# Patient Record
Sex: Female | Born: 1960 | Race: White | Hispanic: No | Marital: Married | Smoking: Never smoker
Health system: Southern US, Community
[De-identification: ages and names within clinical notes are randomized; demographics above are authoritative.]

## PROBLEM LIST (undated history)

## (undated) DIAGNOSIS — G35 Multiple sclerosis: Secondary | ICD-10-CM

## (undated) DIAGNOSIS — K219 Gastro-esophageal reflux disease without esophagitis: Secondary | ICD-10-CM

## (undated) DIAGNOSIS — F419 Anxiety disorder, unspecified: Secondary | ICD-10-CM

## (undated) DIAGNOSIS — N3010 Interstitial cystitis (chronic) without hematuria: Secondary | ICD-10-CM

## (undated) DIAGNOSIS — G709 Myoneural disorder, unspecified: Secondary | ICD-10-CM

## (undated) DIAGNOSIS — I10 Essential (primary) hypertension: Secondary | ICD-10-CM

## (undated) DIAGNOSIS — R011 Cardiac murmur, unspecified: Secondary | ICD-10-CM

## (undated) DIAGNOSIS — I341 Nonrheumatic mitral (valve) prolapse: Secondary | ICD-10-CM

## (undated) DIAGNOSIS — G4730 Sleep apnea, unspecified: Secondary | ICD-10-CM

## (undated) DIAGNOSIS — D649 Anemia, unspecified: Secondary | ICD-10-CM

## (undated) DIAGNOSIS — E669 Obesity, unspecified: Secondary | ICD-10-CM

## (undated) DIAGNOSIS — F32A Depression, unspecified: Secondary | ICD-10-CM

## (undated) DIAGNOSIS — F329 Major depressive disorder, single episode, unspecified: Secondary | ICD-10-CM

## (undated) DIAGNOSIS — C801 Malignant (primary) neoplasm, unspecified: Secondary | ICD-10-CM

## (undated) DIAGNOSIS — E559 Vitamin D deficiency, unspecified: Secondary | ICD-10-CM

## (undated) DIAGNOSIS — N951 Menopausal and female climacteric states: Secondary | ICD-10-CM

## (undated) DIAGNOSIS — R112 Nausea with vomiting, unspecified: Secondary | ICD-10-CM

## (undated) DIAGNOSIS — R002 Palpitations: Secondary | ICD-10-CM

## (undated) DIAGNOSIS — G894 Chronic pain syndrome: Secondary | ICD-10-CM

## (undated) DIAGNOSIS — Z9889 Other specified postprocedural states: Secondary | ICD-10-CM

## (undated) HISTORY — PX: APPENDECTOMY: SHX54

## (undated) HISTORY — PX: BLADDER SUSPENSION: SHX72

## (undated) HISTORY — PX: JOINT REPLACEMENT: SHX530

## (undated) HISTORY — DX: Myoneural disorder, unspecified: G70.9

## (undated) HISTORY — DX: Chronic pain syndrome: G89.4

## (undated) HISTORY — PX: ABDOMINAL HYSTERECTOMY: SHX81

## (undated) HISTORY — DX: Major depressive disorder, single episode, unspecified: F32.9

## (undated) HISTORY — PX: BREAST SURGERY: SHX581

## (undated) HISTORY — DX: Menopausal and female climacteric states: N95.1

## (undated) HISTORY — DX: Multiple sclerosis: G35

## (undated) HISTORY — DX: Anxiety disorder, unspecified: F41.9

## (undated) HISTORY — PX: TUBAL LIGATION: SHX77

## (undated) HISTORY — DX: Interstitial cystitis (chronic) without hematuria: N30.10

## (undated) HISTORY — DX: Obesity, unspecified: E66.9

## (undated) HISTORY — DX: Vitamin D deficiency, unspecified: E55.9

## (undated) HISTORY — DX: Depression, unspecified: F32.A

## (undated) HISTORY — DX: Anemia, unspecified: D64.9

## (undated) HISTORY — PX: CHOLECYSTECTOMY: SHX55

---

## 2004-07-14 ENCOUNTER — Ambulatory Visit: Payer: Self-pay | Admitting: Urology

## 2007-04-22 ENCOUNTER — Ambulatory Visit: Payer: Self-pay | Admitting: Internal Medicine

## 2007-05-21 ENCOUNTER — Ambulatory Visit: Payer: Self-pay | Admitting: Internal Medicine

## 2007-05-26 ENCOUNTER — Ambulatory Visit: Payer: Self-pay | Admitting: Otolaryngology

## 2007-06-30 ENCOUNTER — Emergency Department: Payer: Self-pay | Admitting: Emergency Medicine

## 2007-07-03 ENCOUNTER — Ambulatory Visit: Payer: Self-pay | Admitting: Internal Medicine

## 2007-08-27 ENCOUNTER — Ambulatory Visit: Payer: Self-pay | Admitting: Family Medicine

## 2007-10-04 ENCOUNTER — Ambulatory Visit: Payer: Self-pay | Admitting: Nurse Practitioner

## 2008-10-01 ENCOUNTER — Ambulatory Visit: Payer: Self-pay | Admitting: Internal Medicine

## 2008-10-08 ENCOUNTER — Ambulatory Visit: Payer: Self-pay | Admitting: Nurse Practitioner

## 2008-11-07 ENCOUNTER — Ambulatory Visit: Payer: Self-pay | Admitting: Internal Medicine

## 2008-11-10 ENCOUNTER — Ambulatory Visit: Payer: Self-pay | Admitting: Family Medicine

## 2009-06-12 ENCOUNTER — Ambulatory Visit: Payer: Self-pay | Admitting: Internal Medicine

## 2009-07-21 ENCOUNTER — Ambulatory Visit: Payer: Self-pay | Admitting: Internal Medicine

## 2010-01-20 ENCOUNTER — Ambulatory Visit: Payer: Self-pay | Admitting: Internal Medicine

## 2010-04-03 ENCOUNTER — Ambulatory Visit: Payer: Self-pay | Admitting: Nurse Practitioner

## 2010-04-10 ENCOUNTER — Ambulatory Visit: Payer: Self-pay | Admitting: Nurse Practitioner

## 2010-05-13 ENCOUNTER — Ambulatory Visit: Payer: Self-pay | Admitting: Surgery

## 2010-05-16 ENCOUNTER — Ambulatory Visit: Payer: Self-pay | Admitting: Surgery

## 2010-05-20 LAB — PATHOLOGY REPORT

## 2010-05-24 ENCOUNTER — Ambulatory Visit: Payer: Self-pay | Admitting: Internal Medicine

## 2010-06-24 ENCOUNTER — Ambulatory Visit: Payer: Self-pay | Admitting: Internal Medicine

## 2010-07-04 ENCOUNTER — Ambulatory Visit: Payer: Self-pay | Admitting: Internal Medicine

## 2010-07-24 ENCOUNTER — Ambulatory Visit: Payer: Self-pay | Admitting: Internal Medicine

## 2010-12-02 ENCOUNTER — Ambulatory Visit: Payer: Self-pay | Admitting: Internal Medicine

## 2010-12-05 ENCOUNTER — Ambulatory Visit: Payer: Self-pay | Admitting: Unknown Physician Specialty

## 2011-03-04 ENCOUNTER — Ambulatory Visit: Payer: Self-pay

## 2011-06-22 IMAGING — MG MAM DGTL ADD VW LT  SCR
2 series · 5 of 5 positions shown · non-contrast
Comparison: 04/03/10 and 10/04/07.

REASON FOR EXAM: MICROCALIFICATIONS
COMMENTS:

PROCEDURE:     MAM - MAM DGTL ADD VW LT  SCR  - April 10, 2010  [DATE]
RESULT:

[Series 5824: L ML · left · 4 of 4 slices shown (1 of 2)]
[im 1/4]
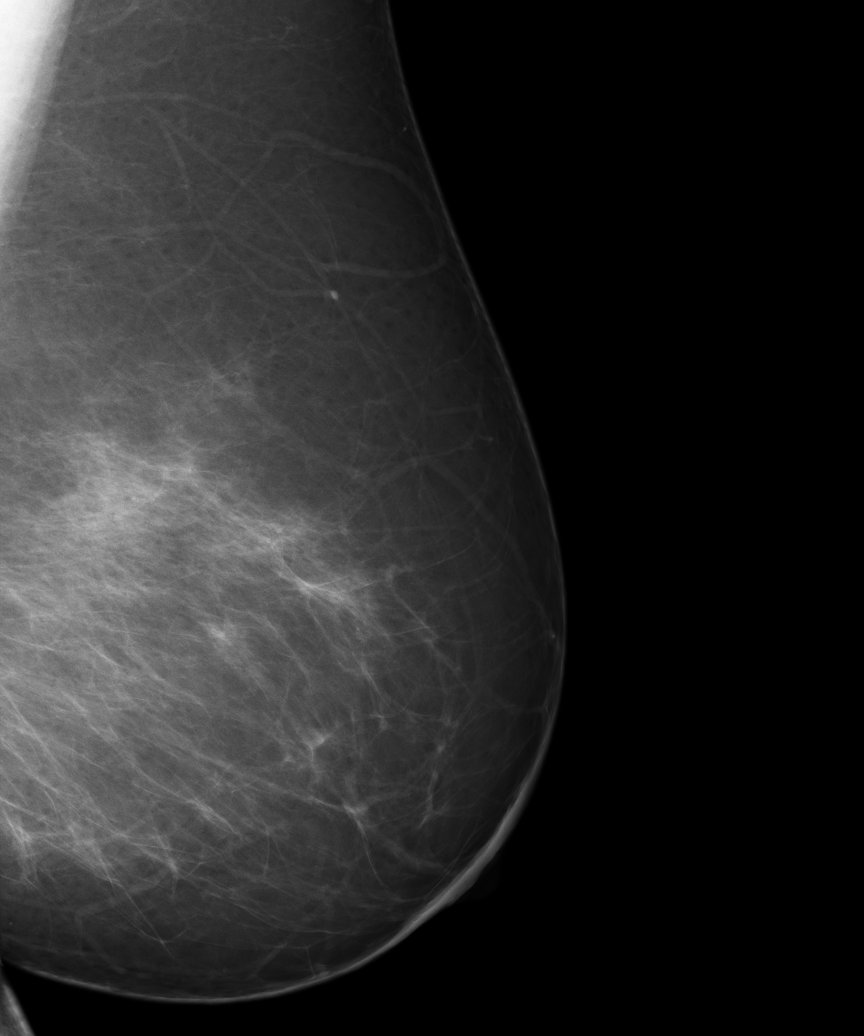
[im 2/4]
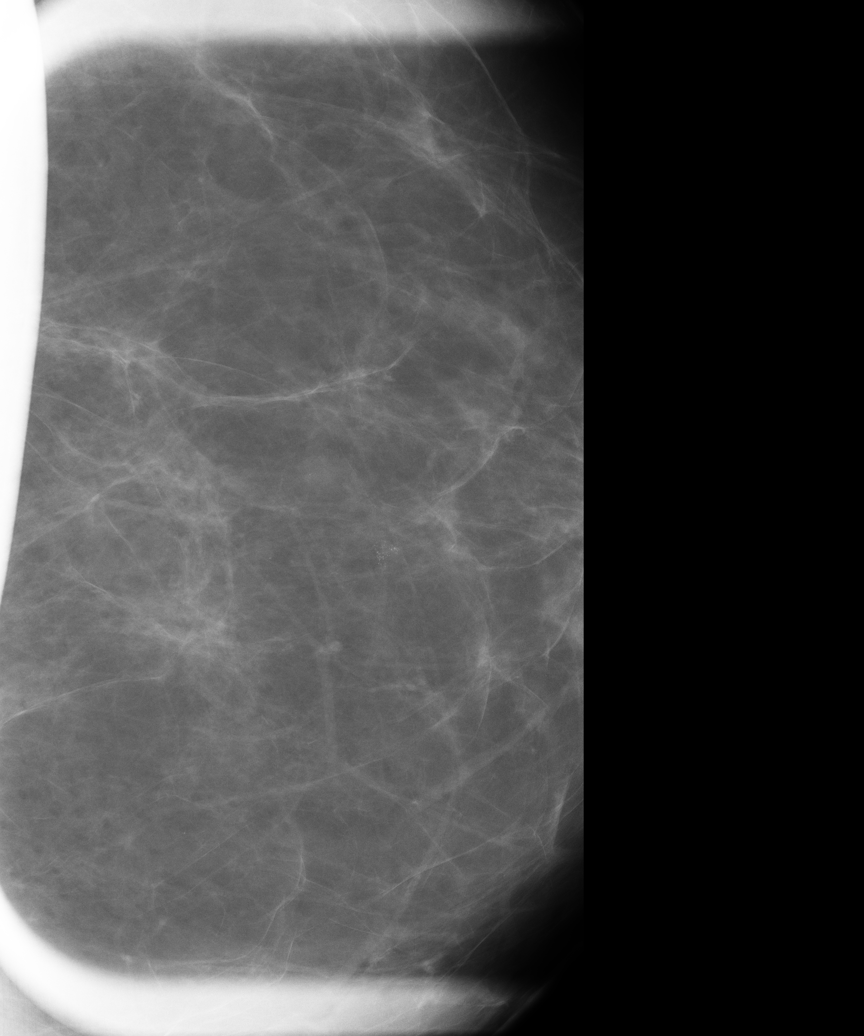
[im 3/4]
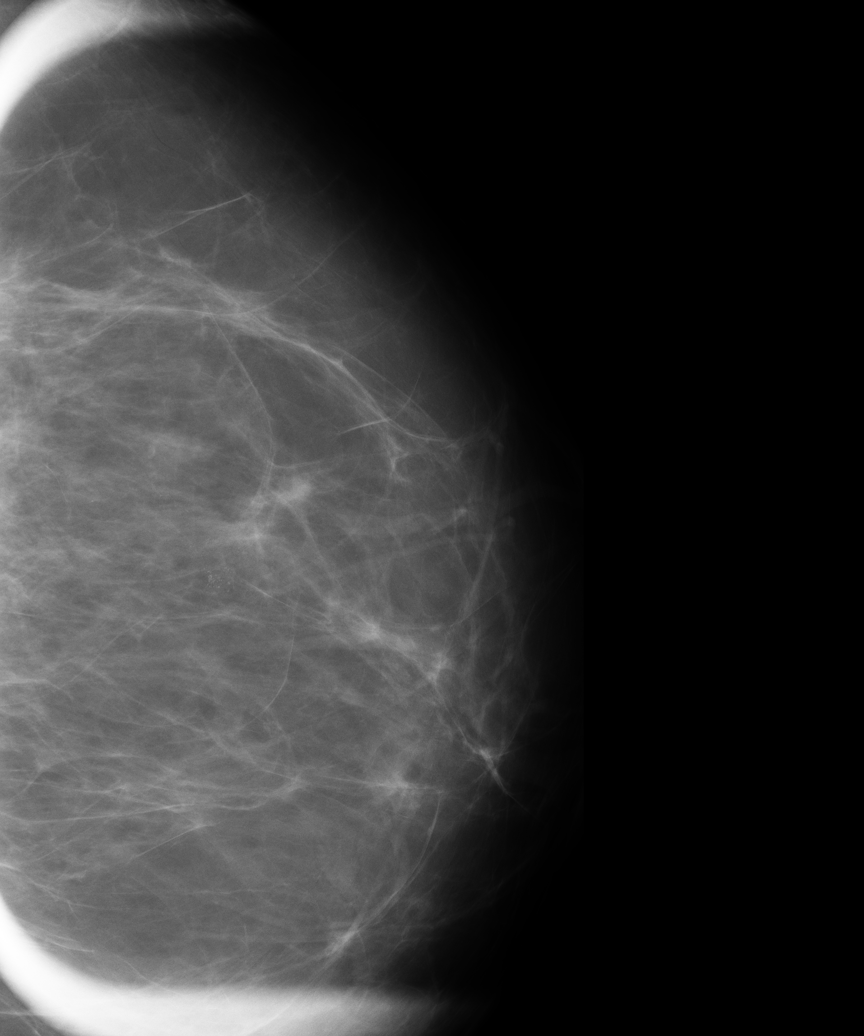
[im 4/4]
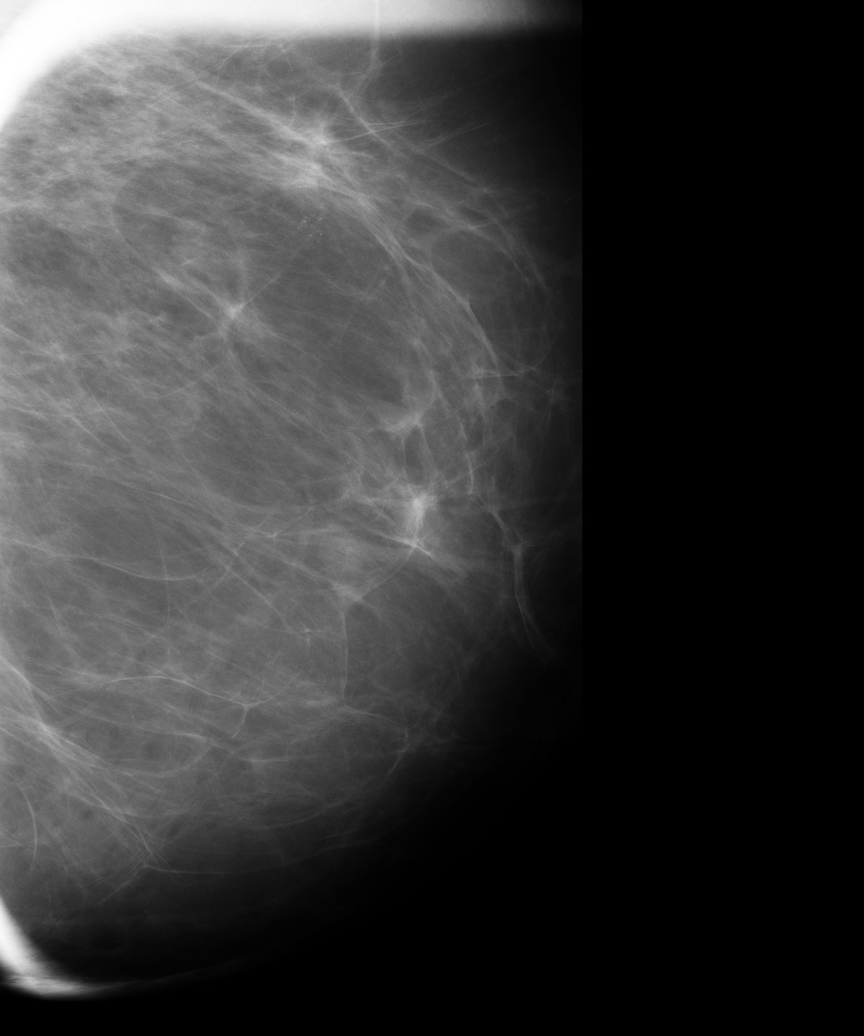

[L ML (2 of 2)]
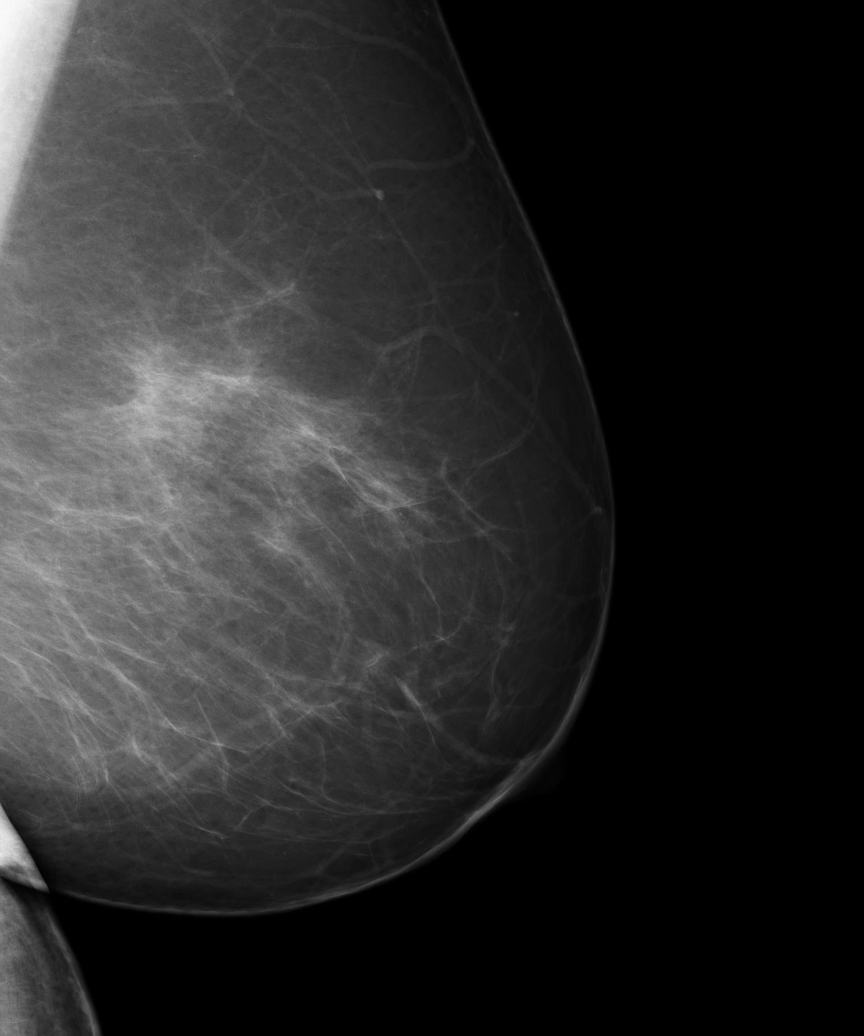

[5 of 5 positions shown; findings below may reference images not displayed]

FINDINGS: True lateral and spot compression magnification views were
performed of the left breast to evaluate the microcalcifications.  In the
anterior depth of the left breast between 12 o'clock and central to the
nipple, there is a small cluster of microcalcifications.  A few are round,
although many are amorphous in shape.  These calcifications were not
appreciated on the prior studies.
IMPRESSION: 1.      BI-RADS:  Category 4- Suspicious Abnormality.
2.     Surgical consultation and tissue diagnosis is recommended.

A negative mammogram report does not preclude biopsy or other evaluation of
a clinically palpable or otherwise suspicious mass or lesion. Breast cancer
may not be detected by mammography in up to 10% of cases.

## 2011-12-08 ENCOUNTER — Ambulatory Visit: Payer: Self-pay | Admitting: Obstetrics and Gynecology

## 2012-01-18 ENCOUNTER — Ambulatory Visit: Payer: Self-pay | Admitting: General Practice

## 2012-01-18 LAB — MRSA PCR SCREENING

## 2012-01-18 LAB — URINALYSIS, COMPLETE
Glucose,UR: NEGATIVE mg/dL (ref 0–75)
Nitrite: POSITIVE
Ph: 6 (ref 4.5–8.0)
Protein: NEGATIVE
RBC,UR: 1 /HPF (ref 0–5)
WBC UR: 48 /HPF (ref 0–5)

## 2012-01-18 LAB — BASIC METABOLIC PANEL
Anion Gap: 8 (ref 7–16)
BUN: 18 mg/dL (ref 7–18)
Calcium, Total: 8.5 mg/dL (ref 8.5–10.1)
Chloride: 106 mmol/L (ref 98–107)
EGFR (African American): >60
Osmolality: 287 (ref 275–301)
Potassium: 3.7 mmol/L (ref 3.5–5.1)

## 2012-01-18 LAB — PROTIME-INR: INR: 0.9

## 2012-01-18 LAB — CBC
HCT: 36.6 % (ref 35.0–47.0)
HGB: 12.4 g/dL (ref 12.0–16.0)
MCH: 31.3 pg (ref 26.0–34.0)
MCV: 92 fL (ref 80–100)
Platelet: 250 10*3/uL (ref 150–440)

## 2012-01-20 LAB — URINE CULTURE

## 2012-02-01 ENCOUNTER — Inpatient Hospital Stay: Payer: Self-pay | Admitting: General Practice

## 2012-02-02 LAB — BASIC METABOLIC PANEL
BUN: 10 mg/dL (ref 7–18)
Calcium, Total: 8.4 mg/dL — ABNORMAL LOW (ref 8.5–10.1)
Co2: 26 mmol/L (ref 21–32)
Creatinine: 0.54 mg/dL — ABNORMAL LOW (ref 0.60–1.30)
EGFR (African American): >60
EGFR (Non-African Amer.): >60
Glucose: 108 mg/dL — ABNORMAL HIGH (ref 65–99)
Osmolality: 285 (ref 275–301)
Potassium: 3.7 mmol/L (ref 3.5–5.1)
Sodium: 143 mmol/L (ref 136–145)

## 2012-02-02 LAB — PLATELET COUNT: Platelet: 211 10*3/uL (ref 150–440)

## 2012-02-03 LAB — BASIC METABOLIC PANEL
Anion Gap: 7 (ref 7–16)
Calcium, Total: 8.5 mg/dL (ref 8.5–10.1)
Chloride: 105 mmol/L (ref 98–107)
Co2: 30 mmol/L (ref 21–32)
Creatinine: 0.66 mg/dL (ref 0.60–1.30)
EGFR (Non-African Amer.): >60
Glucose: 118 mg/dL — ABNORMAL HIGH (ref 65–99)
Sodium: 142 mmol/L (ref 136–145)

## 2012-02-03 LAB — HEMOGLOBIN: HGB: 10.6 g/dL — ABNORMAL LOW (ref 12.0–16.0)

## 2012-02-04 LAB — BASIC METABOLIC PANEL
Anion Gap: 8 (ref 7–16)
Calcium, Total: 8.5 mg/dL (ref 8.5–10.1)
Chloride: 101 mmol/L (ref 98–107)
Creatinine: 0.64 mg/dL (ref 0.60–1.30)
EGFR (Non-African Amer.): >60
Potassium: 3.3 mmol/L — ABNORMAL LOW (ref 3.5–5.1)

## 2012-04-30 ENCOUNTER — Ambulatory Visit: Payer: Self-pay | Admitting: Internal Medicine

## 2012-04-30 LAB — URINALYSIS, COMPLETE
Blood: NEGATIVE
Ketone: NEGATIVE
Nitrite: POSITIVE
Ph: 6 (ref 4.5–8.0)
Protein: NEGATIVE
RBC,UR: NONE SEEN /HPF (ref 0–5)

## 2012-04-30 LAB — WET PREP, GENITAL

## 2012-04-30 LAB — RAPID STREP-A WITH REFLX: Micro Text Report: NEGATIVE

## 2012-05-03 LAB — BETA STREP CULTURE(ARMC)

## 2012-08-04 ENCOUNTER — Ambulatory Visit: Payer: Self-pay

## 2012-08-06 LAB — BETA STREP CULTURE(ARMC)

## 2012-12-08 ENCOUNTER — Ambulatory Visit: Payer: Self-pay | Admitting: Family Medicine

## 2013-05-03 ENCOUNTER — Ambulatory Visit: Payer: Self-pay | Admitting: Otolaryngology

## 2013-07-28 ENCOUNTER — Ambulatory Visit: Payer: Self-pay | Admitting: Medical

## 2013-07-28 LAB — URINALYSIS, COMPLETE
Bilirubin,UR: NEGATIVE
Blood: NEGATIVE
Glucose,UR: NEGATIVE mg/dL (ref 0–75)
Ketone: NEGATIVE
Leukocyte Esterase: NEGATIVE
Nitrite: NEGATIVE
Protein: NEGATIVE
Specific Gravity: 1.005 (ref 1.003–1.030)

## 2013-07-28 LAB — WET PREP, GENITAL

## 2013-07-28 LAB — GC/CHLAMYDIA PROBE AMP

## 2013-07-29 LAB — URINE CULTURE

## 2013-10-03 ENCOUNTER — Encounter: Payer: Self-pay | Admitting: Podiatry

## 2013-10-03 ENCOUNTER — Ambulatory Visit (INDEPENDENT_AMBULATORY_CARE_PROVIDER_SITE_OTHER): Payer: BC Managed Care – PPO | Admitting: Podiatry

## 2013-10-03 VITALS — BP 170/94 | HR 84 | Resp 12

## 2013-10-03 DIAGNOSIS — B079 Viral wart, unspecified: Secondary | ICD-10-CM

## 2013-10-03 NOTE — Progress Notes (Signed)
Subjective:     Patient ID: Linda Sanchez, female   DOB: August 02, 1961, 53 y.o.   MRN: [id number]  HPI patient is found to have a painful callus between the big toe and second toe of the left foot that has been treated by her family physician without resolution of symptoms. States it's been present for several months   Review of Systems  All other systems reviewed and are negative.       Objective:   Physical Exam  Nursing note and vitals reviewed. Constitutional: She is oriented to person, place, and time.  Cardiovascular: Intact distal pulses.   Musculoskeletal: Normal range of motion.  Neurological: She is oriented to person, place, and time.  Skin: Skin is warm.   neurovascular status intact with range of motion adequate and muscle strength adequate. Patient is found to have keratotic lesion between the hallux and second toe right that upon debridement shows pinpoint bleeding with several lesions noted    Assessment:     Probable verruca plantaris plantar and inside hallux and second toe right    Plan:     Explained condition the patient debrided the tissue and applied chemical under occlusion. Explained what to do if blistering should occur and reappoint in 3 weeks earlier if needed

## 2013-10-03 NOTE — Progress Notes (Signed)
   Subjective:    Patient ID: Linda Sanchez, female    DOB: October 13, 1960, 53 y.o.   MRN: [id number]  HPI  '' LT FOOT BETWEEN 1ST AND 2ND TOE HAVE CALLUS IS BURNING AND PAINFUL FOR 5 MONTHS AND ITS GETTING WORSE. TREATMENT EPSON SALT TWICE  A DAY AND CALLUS REMOVAL BUT IS NOT HELPING.''   Review of Systems  Eyes: Positive for visual disturbance.  Endocrine: Positive for cold intolerance and heat intolerance.  Musculoskeletal: Positive for back pain, gait problem and myalgias.  Allergic/Immunologic: Positive for food allergies.  Neurological: Positive for weakness, numbness and headaches.  Hematological: Bruises/bleeds easily.  All other systems reviewed and are negative.       Objective:   Physical Exam        Assessment & Plan:

## 2013-10-17 ENCOUNTER — Ambulatory Visit: Payer: Self-pay | Admitting: Podiatry

## 2013-10-24 ENCOUNTER — Encounter: Payer: Self-pay | Admitting: Podiatry

## 2013-10-24 ENCOUNTER — Ambulatory Visit (INDEPENDENT_AMBULATORY_CARE_PROVIDER_SITE_OTHER): Payer: BC Managed Care – PPO | Admitting: Podiatry

## 2013-10-24 VITALS — BP 155/103 | HR 106 | Resp 16 | Ht 68.0 in | Wt 250.0 lb

## 2013-10-24 DIAGNOSIS — B079 Viral wart, unspecified: Secondary | ICD-10-CM

## 2013-10-24 NOTE — Progress Notes (Signed)
Subjective:     Patient ID: Linda Sanchez, female   DOB: [birth year], 53 y.o.   MRN: [id number]  HPI patient presents stating spot between my big toe and second toe left is improving and I think there is still some wart there but it a lot better. She states that she had a lot of blistering for several days after the initial procedure but she is much better now   Review of Systems     Objective:   Physical Exam Neurovascular status intact with keratotic lesion between the hallux and second toe left that upon debridement shows that there is some small pinpoint bleeding consistent with residual wart tissue    Assessment:     Verruca plantaris improved but present left    Plan:     Debrided tissue folate and applied chemical with sterile dressing. Reappoint if symptoms indicate

## 2013-12-14 ENCOUNTER — Ambulatory Visit: Payer: Self-pay | Admitting: Pediatrics

## 2014-01-12 ENCOUNTER — Ambulatory Visit: Payer: Self-pay | Admitting: Physician Assistant

## 2014-01-30 ENCOUNTER — Ambulatory Visit: Payer: Self-pay | Admitting: Nurse Practitioner

## 2014-04-23 ENCOUNTER — Encounter: Payer: Self-pay | Admitting: General Practice

## 2014-04-24 ENCOUNTER — Encounter: Payer: Self-pay | Admitting: General Practice

## 2014-05-24 ENCOUNTER — Encounter: Payer: Self-pay | Admitting: General Practice

## 2014-06-24 ENCOUNTER — Encounter: Payer: Self-pay | Admitting: General Practice

## 2014-12-16 NOTE — Op Note (Signed)
PATIENT NAME:  Linda Sanchez, Linda Sanchez MR#:  [id number] DATE OF BIRTH:  July 10, 1961  DATE OF PROCEDURE:  02/01/2012  PREOPERATIVE DIAGNOSIS: Degenerative arthrosis of the right knee.   POSTOPERATIVE DIAGNOSIS: Degenerative arthrosis of the right knee.   PROCEDURE PERFORMED: Right total knee arthroplasty using computer-assisted navigation.   SURGEON: Laurice Record. Holley Bouche., MD   ASSISTANT: Vance Peper, PA-C (required to maintain retraction throughout the procedure)   ANESTHESIA: Femoral nerve block and spinal.   ESTIMATED BLOOD LOSS: 250 mL.   FLUIDS REPLACED: 1600 mL of Crystalloid.   TOURNIQUET TIME: 88 minutes.   DRAINS: Two medium drains to a reinfusion system.   SOFT TISSUE RELEASES: Anterior cruciate ligament, posterior cruciate ligament, deep medial collateral ligament, and patellofemoral ligament.   IMPLANTS UTILIZED: DePuy PFC Sigma size 3 posterior stabilized femoral component (cemented), size 3 MBT tibial component (cemented), 32 mm three peg oval dome patella (cemented), and a 12.5 mm stabilized rotating platform polyethylene insert.   INDICATIONS FOR SURGERY: The patient is a 54 year old female who has been seen for complaints of progressive right knee pain. She localized this pain along the medial aspect of the knee. X-rays demonstrated severe degenerative changes in tricompartmental fashion with varus deformity. After discussion of the risks and benefits of surgical intervention, the patient expressed her understanding of the risks and benefits and agreed with plans for surgical intervention.   PROCEDURE IN DETAIL: The patient was brought in the operating room and, after adequate femoral nerve block and spinal anesthesia was achieved, a tourniquet was placed on the patient's upper right thigh. The patient's right knee and leg were cleaned and prepped with alcohol and DuraPrep and draped in the usual sterile fashion. A "time-out" was performed as per usual protocol. The right lower  extremity was exsanguinated using an Esmarch, and the tourniquet was inflated to 300 mmHg. Anterior longitudinal incision was made followed by a standard mid vastus approach. The deep fibers of the medial collateral ligament were elevated in a subperiosteal fashion off the medial flare of the tibia so as to maintain a continuous soft tissue sleeve. The patella was subluxed laterally and the patellofemoral ligament was incised. Inspection of the knee demonstrated severe degenerative changes in a tricompartmental fashion. Prominent osteophytes were debrided using a rongeur. Anterior and posterior cruciate ligaments were excised. Two 4.0 mm Schanz pins were inserted into the femur and into the tibia for attachment of the array of trackers used for computer-assisted navigation. Hip center was identified using circumduction technique. Distal landmarks were mapped using the computer. Distal femur and proximal tibia were mapped using the computer. Distal femoral cutting guide was positioned using computer-assisted navigation so as to achieve a 5 degree distal valgus cut. Cut was performed and verified using the computer. Distal femur was then sized and it was felt that a size 3 femoral component was appropriate. Size 3 cutting guide was positioned using computer-assisted navigation and the anterior cut was performed and verified. This was followed by completion of the posterior and chamfer cuts. Femoral cutting guide for the central box was then positioned and central box cut was performed.   Attention was then directed to the proximal tibia. Medial and lateral menisci were excised. The extramedullary tibial cutting guide was positioned using computer-assisted navigation so as to achieve a 0 degree varus valgus alignment and 0 degree posterior slope. Cut was performed and verified using the computer. Proximal tibia was sized and it was felt that a size 3 tibial tray was appropriate. Tibial and  femoral trials were  inserted followed by insertion of first a 10 and subsequently a 12.5 mm polyethylene trial. Excellent mediolateral soft tissue balancing was appreciated both in full extension and in flexion. Finally, patella was cut and prepared so as to accommodate a 32 mm three peg oval dome patella. Patellar trial was placed and knee was placed through a range of motion with excellent patellar tracking appreciated.   Femoral trial was removed. Central post hole for the tibial component was reamed followed by insertion of a keel punch. Tibial trial was then removed. Cut surfaces of bone were irrigated with copious amounts of normal saline with antibiotic solution using pulsatile lavage and then suctioned dry. Polymethyl methacrylate cement with gentamicin was prepared in the usual fashion using a vacuum mixer. Cement was applied to the cut surface of the proximal tibia as well as along the undersurface of a size 3 MBT tibial component. Tibial component was positioned and impacted into place. Excess cement was removed using freer elevators. Cement was then applied to the cut surface of the femur as well as along the posterior flanges of size 3 posterior stabilized femoral component. Femoral component was positioned and impacted into place. Excess cement was removed using freer elevators. A 12.5 mm polyethylene trial was inserted and the knee was brought in full extension with steady axial compression applied. Finally, cement was applied to the backside of a 32 mm three peg oval dome patella and patellar component was positioned and patellar clamp applied. Excess cement was removed using freer elevators.   After adequate curing of cement, tourniquet was deflated after a total tourniquet time of 88 minutes. Hemostasis was achieved using electrocautery. The knee was irrigated with copious amounts of normal saline with antibiotic solution using pulsatile lavage and then suctioned dry. The knee was inspected for any residual cement  debris. 30 mL of 0.25% Marcaine with epinephrine was injected along the posterior capsule. A 12.5 mm stabilized rotating platform polyethylene insert was inserted and the knee was placed through a range of motion. Excellent mediolateral soft tissue balancing was appreciated and excellent patellar tracking was appreciated.   Two medium drains were placed in the wound bed and brought out through a separate stab incision to be attached to a reinfusion system. The medial parapatellar portion of the incision was reapproximated using interrupted sutures of #1 Vicryl. Subcutaneous tissue was approximated in layers using first #0 Vicryl followed by #2-0 Vicryl. Skin was closed with skin staples. Sterile dressing was applied.   The patient tolerated the procedure well. She was transported to the recovery room in stable condition.   ____________________________ Laurice Record. Holley Bouche., MD jph:drc D: 02/01/2012 23:09:23 ET T: 02/02/2012 09:41:23 ET JOB#: [id number]  cc: Laurice Record. Holley Bouche., MD, <Dictator> JAMES P Holley Bouche MD ELECTRONICALLY SIGNED 02/12/2012 12:30

## 2014-12-16 NOTE — Op Note (Signed)
PATIENT NAME:  Linda Sanchez, Linda Sanchez MR#:  [id number] DATE OF BIRTH:  [birth year]  DATE OF PROCEDURE:  02/01/2012  PROCEDURE: Right femoral nerve block.   SURGEON: Marshell Rieger P. Phineas Douglas, MD  INDICATION: To help this patient with postoperative pain about to have a right total knee replacement with navigation by Dr. Skip Estimable.   DESCRIPTION OF PROCEDURE: The risks and benefits of general anesthesia, spinal anesthesia and a femoral nerve block were discussed with the patient and her husband preoperatively in same day surgery. She elected to proceed with a subarachnoid block and a femoral nerve block with general anesthesia as a back up. Consent was obtained. The patient was taken to Operating Room 11. The usual monitors were applied. She was lightly sedated. She was still awake, talkative, and in good verbal communication with [place]. She had a Betadine prep of her right upper thigh and inguinal crease x3 and a sterile technique was used. Of course this was done after the timeout. Using a 22-gauge 2 inch Stimuplex needle and 0.7 mA of output the Stimuplex needle was advanced approximately 1 inch lateral to the right femoral artery pulsation. There was good right thigh movement on the first pass. There was no heme or paresthesias. She had a total of 30 mL of 0.25% bupivacaine with 1:400,000 of epinephrine injected incrementally. She tolerated the block without problem or complication. I am hopeful that it will help give her significant pain relief for the duration of the block.  ____________________________ Werner Lean. Phineas Douglas, MD spp:cms D: 02/01/2012 07:29:31 ET T: 02/01/2012 08:18:25 ET JOB#: [id number]  cc: Nicki Reaper P. Phineas Douglas, MD, <Dictator>  Lucilla Edin MD ELECTRONICALLY SIGNED 02/01/2012 9:54

## 2014-12-16 NOTE — Discharge Summary (Signed)
PATIENT NAME:  Linda Sanchez, Linda Sanchez MR#:  [id number] DATE OF BIRTH:  Oct 07, 1960  DATE OF ADMISSION:  02/01/2012 DATE OF DISCHARGE:  02/04/2012  ADMITTING DIAGNOSIS: Degenerative arthrosis of right knee.   DISCHARGE DIAGNOSIS: Degenerative arthrosis of right knee.   HISTORY: Patient is a 54 year old who has been followed at [facility] for progression of right knee discomfort. The patient had reported a several year history of progressive knee pain. She had localized most of the pain along the medial aspect of the knee. Her pain was noted to be aggravated with weight-bearing activities. At the time of surgery she was not using any ambulatory aid. She did report some pain at night. She had not used any anti-inflammatories secondary to significant reflux disease. She did not see any significant improvement in her condition despite cortisone injections. Her pain had progressed to the point that it was significantly interfering with her activities of daily living. X-rays taken in [place] showed significant narrowing of the medial cartilage space with bone-on-bone articulation noted. She was noted to have subchondral sclerosis as well. After discussion of the risks and benefits of surgical intervention, patient expressed her understanding of the risks and benefits and agreed for plans for surgical intervention.   PROCEDURE: Right total knee arthroplasty using computer-assisted navigation.   ANESTHESIA: Femoral nerve block with spinal.   SOFT TISSUE RELEASE: Anterior cruciate ligament, posterior cruciate ligament, deep medial collateral ligaments, as well as the patellofemoral ligament.   IMPLANTS UTILIZED: DePuy PFC Sigma size 3 posterior stabilized femoral component (cemented), size 3 MBT tibial component (cemented), 32 mm three pegged oval dome patella (cemented), and a 12.5 mm stabilized rotating platform polyethylene insert.   HOSPITAL COURSE: Patient tolerated  procedure very well. She had no complications. She was then taken to PAC-U where she was stabilized then transferred to the orthopedic floor. She began receiving anticoagulation therapy of Lovenox per hospital pharmacy and anesthesia protocol. She was fitted with TED stockings bilaterally. These were allowed to be removed one hour per eight hour shift. The right one was applied on day two following removal of the Hemovac and dressing change. She was also fitted with the AV-I compression foot pumps set at 80 mmHg. Her calves have been nontender, free of any evidence of any deep venous thromboses. Negative Homans sign. There has been having no evidence of any deep venous thromboses. Heels were elevated off the bed using rolled towels. She has voiced no complaints.   Vital signs have been stable. She has been afebrile. Hemodynamically she was stable. No transfusions were given other than the Autovac transfusion given the first six hours postoperatively. Patient's laboratory values have all been within normal limits except for the potassium which was 3.3 and subsequently this was supplemented with potassium 20 mEq b.i.d. She has had no symptoms.   Physical therapy was initiated on day one for gait training and transfers. Upon being discharged was ambulating greater than 200 feet. Was independent with bed to chair transfers. Was able go up and down four sets of steps. Physical therapy was initiated on day one for activities of daily living and assistive devices.   Patient's IV, Foley and Hemovac were all discontinued on day two along with a dressing change. The wound has been free of any drainage or any signs of infection. The Polar Care was reapplied to the surgical leg maintaining a temperature of 40 to 50 degrees Fahrenheit. The wound has been free of any drainage or any signs of  infection throughout the hospital course.   DISPOSITION: Patient is being discharged to home in improved stable condition.    DISCHARGE INSTRUCTIONS:  1. She may weight bear as tolerated. Continue using a walker until cleared by physical therapy to go to a quad cane.  2. She is to continue using TED stockings. These are to be worn during the day but may remove them at night.  3. Continue Polar Care maintaining a temperature of 40 to 50 degrees Fahrenheit.  4. She will receive home health physical therapy.  5. She is placed on a regular diet.  6. She has a follow-up appointment in the clinic in two weeks for removal of staples. She is to call the clinic sooner if any temperatures of 101.5 or greater or excessive bleeding.  7. She was given three prescriptions. This consisted of Lovenox 40 mg subcutaneously daily for 14 days, then discontinue and begin taking one 81 mg enteric-coated aspirin. Second one was for Roxicodone 5 to 10 mg every 4 to 6 hours p.r.n. for pain and third one was Tramadol 50 to 100 mg every 4 to 6 hours p.r.n. for pain.   PAST MEDICAL HISTORY:  1. Seasonal allergies.  2. Chickenpox.  3. Breast lumps.  4. Shingles.  5. Depression, anxiety.  6. Arthritis.  7. Mitral valve prolapse.  8. Osteoarthritis.  9. Multiple sclerosis since 2008. 10. Gastroesophageal reflux disease. 11. Hypertension.  12. Migraine headaches. 13. Interstitial cystitis. 14. Vitamin D deficiency.   ____________________________ Vance Peper, PA jrw:cms D: 02/04/2012 07:26:47 ET T: 02/04/2012 11:52:36 ET JOB#: [id number]  cc: Vance Peper, PA, <Dictator>  Wisdom Seybold PA ELECTRONICALLY SIGNED 02/04/2012 21:33

## 2014-12-19 ENCOUNTER — Ambulatory Visit
Admit: 2014-12-19 | Disposition: A | Discharge status: Home / Self Care | Payer: Self-pay | Attending: Pediatrics | Admitting: Pediatrics

## 2014-12-25 ENCOUNTER — Inpatient Hospital Stay: Admission: RE | Admit: 2014-12-25 | Payer: Self-pay | Source: Ambulatory Visit

## 2014-12-25 ENCOUNTER — Encounter: Payer: Self-pay | Admitting: *Deleted

## 2014-12-25 DIAGNOSIS — G35 Multiple sclerosis: Secondary | ICD-10-CM | POA: Diagnosis not present

## 2014-12-25 DIAGNOSIS — I341 Nonrheumatic mitral (valve) prolapse: Secondary | ICD-10-CM | POA: Diagnosis not present

## 2014-12-25 DIAGNOSIS — F419 Anxiety disorder, unspecified: Secondary | ICD-10-CM | POA: Diagnosis not present

## 2014-12-25 DIAGNOSIS — N3010 Interstitial cystitis (chronic) without hematuria: Secondary | ICD-10-CM | POA: Diagnosis present

## 2014-12-25 DIAGNOSIS — Z803 Family history of malignant neoplasm of breast: Secondary | ICD-10-CM | POA: Diagnosis not present

## 2014-12-25 DIAGNOSIS — N3281 Overactive bladder: Secondary | ICD-10-CM | POA: Diagnosis not present

## 2014-12-25 DIAGNOSIS — D649 Anemia, unspecified: Secondary | ICD-10-CM | POA: Diagnosis not present

## 2014-12-25 DIAGNOSIS — E669 Obesity, unspecified: Secondary | ICD-10-CM | POA: Diagnosis not present

## 2014-12-25 DIAGNOSIS — M1990 Unspecified osteoarthritis, unspecified site: Secondary | ICD-10-CM | POA: Diagnosis not present

## 2014-12-25 DIAGNOSIS — Z881 Allergy status to other antibiotic agents status: Secondary | ICD-10-CM | POA: Diagnosis not present

## 2014-12-25 DIAGNOSIS — F5104 Psychophysiologic insomnia: Secondary | ICD-10-CM | POA: Diagnosis not present

## 2014-12-25 DIAGNOSIS — Z79899 Other long term (current) drug therapy: Secondary | ICD-10-CM | POA: Diagnosis not present

## 2014-12-25 DIAGNOSIS — G4730 Sleep apnea, unspecified: Secondary | ICD-10-CM | POA: Diagnosis not present

## 2014-12-25 DIAGNOSIS — N951 Menopausal and female climacteric states: Secondary | ICD-10-CM | POA: Diagnosis not present

## 2014-12-25 DIAGNOSIS — R011 Cardiac murmur, unspecified: Secondary | ICD-10-CM | POA: Diagnosis not present

## 2014-12-25 DIAGNOSIS — R5383 Other fatigue: Secondary | ICD-10-CM | POA: Diagnosis not present

## 2014-12-25 DIAGNOSIS — F329 Major depressive disorder, single episode, unspecified: Secondary | ICD-10-CM | POA: Diagnosis not present

## 2014-12-25 DIAGNOSIS — Z885 Allergy status to narcotic agent status: Secondary | ICD-10-CM | POA: Diagnosis not present

## 2014-12-31 ENCOUNTER — Encounter: Payer: Self-pay | Admitting: *Deleted

## 2015-01-01 ENCOUNTER — Encounter
Admission: RE | Disposition: A | Discharge status: Home / Self Care | Payer: Self-pay | Source: Ambulatory Visit | Attending: Urology

## 2015-01-01 ENCOUNTER — Ambulatory Visit: Payer: BLUE CROSS/BLUE SHIELD | Admitting: Anesthesiology

## 2015-01-01 ENCOUNTER — Ambulatory Visit
Admission: RE | Admit: 2015-01-01 | Discharge: 2015-01-01 | Disposition: A | Discharge status: Home / Self Care | Payer: BLUE CROSS/BLUE SHIELD | Source: Ambulatory Visit | Attending: Urology | Admitting: Urology

## 2015-01-01 ENCOUNTER — Encounter: Payer: Self-pay | Admitting: *Deleted

## 2015-01-01 DIAGNOSIS — Z79899 Other long term (current) drug therapy: Secondary | ICD-10-CM | POA: Insufficient documentation

## 2015-01-01 DIAGNOSIS — D649 Anemia, unspecified: Secondary | ICD-10-CM | POA: Insufficient documentation

## 2015-01-01 DIAGNOSIS — Z881 Allergy status to other antibiotic agents status: Secondary | ICD-10-CM | POA: Insufficient documentation

## 2015-01-01 DIAGNOSIS — R011 Cardiac murmur, unspecified: Secondary | ICD-10-CM | POA: Insufficient documentation

## 2015-01-01 DIAGNOSIS — N3010 Interstitial cystitis (chronic) without hematuria: Secondary | ICD-10-CM | POA: Insufficient documentation

## 2015-01-01 DIAGNOSIS — N3281 Overactive bladder: Secondary | ICD-10-CM | POA: Insufficient documentation

## 2015-01-01 DIAGNOSIS — R5383 Other fatigue: Secondary | ICD-10-CM | POA: Insufficient documentation

## 2015-01-01 DIAGNOSIS — F5104 Psychophysiologic insomnia: Secondary | ICD-10-CM | POA: Insufficient documentation

## 2015-01-01 DIAGNOSIS — Z803 Family history of malignant neoplasm of breast: Secondary | ICD-10-CM | POA: Insufficient documentation

## 2015-01-01 DIAGNOSIS — E669 Obesity, unspecified: Secondary | ICD-10-CM | POA: Insufficient documentation

## 2015-01-01 DIAGNOSIS — I341 Nonrheumatic mitral (valve) prolapse: Secondary | ICD-10-CM | POA: Insufficient documentation

## 2015-01-01 DIAGNOSIS — F329 Major depressive disorder, single episode, unspecified: Secondary | ICD-10-CM | POA: Insufficient documentation

## 2015-01-01 DIAGNOSIS — N951 Menopausal and female climacteric states: Secondary | ICD-10-CM | POA: Insufficient documentation

## 2015-01-01 DIAGNOSIS — Z885 Allergy status to narcotic agent status: Secondary | ICD-10-CM | POA: Insufficient documentation

## 2015-01-01 DIAGNOSIS — G35 Multiple sclerosis: Secondary | ICD-10-CM | POA: Insufficient documentation

## 2015-01-01 DIAGNOSIS — G4730 Sleep apnea, unspecified: Secondary | ICD-10-CM | POA: Insufficient documentation

## 2015-01-01 DIAGNOSIS — M1990 Unspecified osteoarthritis, unspecified site: Secondary | ICD-10-CM | POA: Insufficient documentation

## 2015-01-01 DIAGNOSIS — F419 Anxiety disorder, unspecified: Secondary | ICD-10-CM | POA: Insufficient documentation

## 2015-01-01 HISTORY — DX: Nausea with vomiting, unspecified: R11.2

## 2015-01-01 HISTORY — DX: Palpitations: R00.2

## 2015-01-01 HISTORY — DX: Sleep apnea, unspecified: G47.30

## 2015-01-01 HISTORY — PX: CYSTO WITH HYDRODISTENSION: SHX5453

## 2015-01-01 HISTORY — DX: Cardiac murmur, unspecified: R01.1

## 2015-01-01 HISTORY — DX: Gastro-esophageal reflux disease without esophagitis: K21.9

## 2015-01-01 HISTORY — DX: Nonrheumatic mitral (valve) prolapse: I34.1

## 2015-01-01 HISTORY — DX: Other specified postprocedural states: Z98.890

## 2015-01-01 SURGERY — CYSTOSCOPY, WITH BLADDER HYDRODISTENSION
Anesthesia: General | Wound class: Clean Contaminated

## 2015-01-01 MED ORDER — SCOPOLAMINE 1 MG/3DAYS TD PT72
1.0000 | MEDICATED_PATCH | Freq: Once | TRANSDERMAL | Status: DC
  Administered 2015-01-01: 1.5 mg via TRANSDERMAL

## 2015-01-01 MED ORDER — LACTATED RINGERS IV SOLN
INTRAVENOUS | Status: DC
  Administered 2015-01-01 (×2): via INTRAVENOUS

## 2015-01-01 MED ORDER — LIDOCAINE HCL 2 % EX GEL
CUTANEOUS | Status: AC
  Filled 2015-01-01: qty 10

## 2015-01-01 MED ORDER — ROCURONIUM BROMIDE 100 MG/10ML IV SOLN
INTRAVENOUS | Status: DC | PRN
  Administered 2015-01-01: 5 mg via INTRAVENOUS

## 2015-01-01 MED ORDER — LIDOCAINE HCL (CARDIAC) 20 MG/ML IV SOLN
INTRAVENOUS | Status: DC | PRN
  Administered 2015-01-01: 60 mg via INTRAVENOUS

## 2015-01-01 MED ORDER — BELLADONNA ALKALOIDS-OPIUM 16.2-60 MG RE SUPP
RECTAL | Status: AC
  Filled 2015-01-01: qty 1

## 2015-01-01 MED ORDER — LIDOCAINE HCL 2 % IJ SOLN
INTRAMUSCULAR | Status: AC
  Filled 2015-01-01: qty 20

## 2015-01-01 MED ORDER — LIDOCAINE HCL 2 % IJ SOLN
INTRAMUSCULAR | Status: DC | PRN
  Administered 2015-01-01: 20 mL

## 2015-01-01 MED ORDER — FLUCONAZOLE 200 MG PO TABS: 200.0000 mg | ORAL_TABLET | Freq: Every day | ORAL | Status: DC

## 2015-01-01 MED ORDER — STERILE WATER FOR IRRIGATION IR SOLN
Status: DC | PRN
  Administered 2015-01-01: 1000 mL

## 2015-01-01 MED ORDER — CEFAZOLIN SODIUM 1-5 GM-% IV SOLN
1.0000 g | Freq: Once | INTRAVENOUS | Status: AC
  Administered 2015-01-01: 1 g via INTRAVENOUS

## 2015-01-01 MED ORDER — SUCCINYLCHOLINE CHLORIDE 20 MG/ML IJ SOLN
INTRAMUSCULAR | Status: DC | PRN
  Administered 2015-01-01: 120 mg via INTRAVENOUS

## 2015-01-01 MED ORDER — ONDANSETRON HCL 4 MG/2ML IJ SOLN
INTRAMUSCULAR | Status: AC
  Filled 2015-01-01: qty 2

## 2015-01-01 MED ORDER — BELLADONNA ALKALOIDS-OPIUM 16.2-60 MG RE SUPP
RECTAL | Status: DC | PRN
  Administered 2015-01-01: 1 via RECTAL

## 2015-01-01 MED ORDER — ONDANSETRON HCL 4 MG/2ML IJ SOLN
INTRAMUSCULAR | Status: DC | PRN
  Administered 2015-01-01: 4 mg via INTRAVENOUS

## 2015-01-01 MED ORDER — MIDAZOLAM HCL 2 MG/2ML IJ SOLN
INTRAMUSCULAR | Status: DC | PRN
  Administered 2015-01-01: 2 mg via INTRAVENOUS

## 2015-01-01 MED ORDER — FENTANYL CITRATE (PF) 100 MCG/2ML IJ SOLN
INTRAMUSCULAR | Status: DC | PRN
  Administered 2015-01-01: 50 ug via INTRAVENOUS
  Administered 2015-01-01: 1 ug via INTRAVENOUS

## 2015-01-01 MED ORDER — DEXAMETHASONE SODIUM PHOSPHATE 4 MG/ML IJ SOLN
INTRAMUSCULAR | Status: DC | PRN
  Administered 2015-01-01: 5 mg via INTRAVENOUS

## 2015-01-01 MED ORDER — SCOPOLAMINE 1 MG/3DAYS TD PT72
MEDICATED_PATCH | TRANSDERMAL | Status: AC
  Administered 2015-01-01: 1.5 mg via TRANSDERMAL
  Filled 2015-01-01: qty 1

## 2015-01-01 MED ORDER — FENTANYL CITRATE (PF) 100 MCG/2ML IJ SOLN: 25.0000 ug | INTRAMUSCULAR | Status: DC | PRN

## 2015-01-01 MED ORDER — LIDOCAINE HCL 2 % EX GEL
CUTANEOUS | Status: DC | PRN
  Administered 2015-01-01: 1 via URETHRAL

## 2015-01-01 MED ORDER — PROPOFOL 10 MG/ML IV BOLUS
INTRAVENOUS | Status: DC | PRN
  Administered 2015-01-01: 170 mg via INTRAVENOUS

## 2015-01-01 MED ORDER — ONDANSETRON HCL 4 MG/2ML IJ SOLN
4.0000 mg | Freq: Once | INTRAMUSCULAR | Status: AC | PRN
  Administered 2015-01-01: 4 mg via INTRAVENOUS

## 2015-01-01 SURGICAL SUPPLY — 15 items
BAG DRAIN CYSTO-URO LG1000N (MISCELLANEOUS) ×3 IMPLANT
CATH ROBINSON RED A/P 16FR (CATHETERS) ×2 IMPLANT
GLOVE BIO SURGEON STRL SZ7 (GLOVE) ×6 IMPLANT
GLOVE BIO SURGEON STRL SZ7.5 (GLOVE) ×3 IMPLANT
GOWN STRL REUS W/ TWL LRG LVL3 (GOWN DISPOSABLE) ×1 IMPLANT
GOWN STRL REUS W/ TWL XL LVL3 (GOWN DISPOSABLE) ×1 IMPLANT
GOWN STRL REUS W/TWL LRG LVL3 (GOWN DISPOSABLE) ×3
GOWN STRL REUS W/TWL XL LVL3 (GOWN DISPOSABLE) ×3
PACK CYSTO AR (MISCELLANEOUS) ×3 IMPLANT
PAD GROUND ADULT SPLIT (MISCELLANEOUS) ×1 IMPLANT
PREP PVP WINGED SPONGE (MISCELLANEOUS) ×3 IMPLANT
SCRUB POVIDONE IODINE 4 OZ (MISCELLANEOUS) ×1 IMPLANT
SET CYSTO W/LG BORE CLAMP LF (SET/KITS/TRAYS/PACK) ×3 IMPLANT
WATER STERILE IRR 1000ML POUR (IV SOLUTION) ×3 IMPLANT
WATER STERILE IRR 3000ML UROMA (IV SOLUTION) ×3 IMPLANT

## 2015-01-01 NOTE — Op Note (Signed)
  Preoperative diagnosis: Interstitial cystitis Postoperative diagnosis: Interstitial cystitis  Procedure: Cystoscopy with hydrodilatation   Surgeon: Otelia Limes. Yves Dill MD, FACS Anesthesia: Gen.  Indications:See the history and physical. After informed consent the above procedure(s) were requested     Technique and findings: After adequate general anesthesia had been obtained patient was placed into dorsal lithotomy position and the perineum was prepped and draped in the usual fashion. The 21 French cystoscope sheath was advanced into the bladder with the obturator in place. The cystoscope was coupled to the camera and and placed into the sheath. The bladder was then thoroughly inspected. Both ureteral orifices were identified and had clear reflux. No bladder mucosal lesions were identified. At this point the bladder was filled to capacity at 70 cm water pressure. The bladder was drained and reinspected. Latter capacity was 1100 cc. Patient had glomerulations diffusely present. Hydrodilatation was repeated. Capacity was again noted to be 1100 cc. The bladder was then completely drained. Cystoscope was removed and an 18 French red Robinson catheter was placed and 30 cc of 2% Xylocaine instilled within the bladder. Red Robinson catheter was removed. 10 cc of viscous Xylocaine was then instilled within the urethra and the bladder. A B&O suppository was placed. The procedure was then terminated and the patient was transferred to the recovery room in stable condition.

## 2015-01-01 NOTE — Transfer of Care (Signed)
Immediate Anesthesia Transfer of Care Note  Patient: Linda Sanchez  Procedure(s) Performed: Procedure(s): CYSTOSCOPY/HYDRODISTENSION (N/A)  Patient Location: PACU  Anesthesia Type:General  Level of Consciousness: awake, alert  and oriented  Airway & Oxygen Therapy: Patient Spontanous Breathing and Patient connected to face mask oxygen  Post-op Assessment: Report given to RN  Post vital signs: Reviewed and stable  Last Vitals:  Filed Vitals:   01/01/15 1354  BP: 141/77  Pulse: 72  Temp: 36.6 C  Resp: 14    Complications: No apparent anesthesia complications

## 2015-01-01 NOTE — Anesthesia Preprocedure Evaluation (Signed)
Anesthesia Evaluation  Patient identified by MRN, date of birth, ID band Patient awake    History of Anesthesia Complications (+) PONV  Airway Mallampati: II  TM Distance: >3 FB Neck ROM: Full    Dental  (+) Chipped   Pulmonary sleep apnea ,  breath sounds clear to auscultation  Pulmonary exam normal       Cardiovascular Exercise Tolerance: Poor + Valvular Problems/Murmurs MVP Rhythm:Regular Rate:Normal  Past hx of MVP, but echo in 2015 did not show MVP   Neuro/Psych Anxiety Depression Hx of MS  Neuromuscular disease    GI/Hepatic Neg liver ROS, GERD-  Medicated and Controlled,  Endo/Other  negative endocrine ROS  Renal/GU negative Renal ROS Bladder dysfunction  Interstitial cystitis negative genitourinary   Musculoskeletal   Abdominal Normal abdominal exam  (+)   Peds negative pediatric ROS (+)  Hematology  (+) anemia ,   Anesthesia Other Findings   Reproductive/Obstetrics                             Anesthesia Physical Anesthesia Plan  ASA: III  Anesthesia Plan: General   Post-op Pain Management:    Induction: Intravenous, Rapid sequence and Cricoid pressure planned  Airway Management Planned: Oral ETT  Additional Equipment:   Intra-op Plan:   Post-operative Plan: Extubation in OR  Informed Consent: I have reviewed the patients History and Physical, chart, labs and discussed the procedure including the risks, benefits and alternatives for the proposed anesthesia with the patient or authorized representative who has indicated his/her understanding and acceptance.   Dental advisory given  Plan Discussed with: CRNA and Surgeon  Anesthesia Plan Comments:         Anesthesia Quick Evaluation

## 2015-01-01 NOTE — Discharge Instructions (Signed)
See Dr. Samuel Germany instructions on cystoscopy and interstitial cystitisGeneral Anesthesia, Care After Refer to this sheet in the next few weeks. These instructions provide you with information on caring for yourself after your procedure. Your health care provider may also give you more specific instructions. Your treatment has been planned according to current medical practices, but problems sometimes occur. Call your health care provider if you have any problems or questions after your procedure. WHAT TO EXPECT AFTER THE PROCEDURE After the procedure, it is typical to experience:  Sleepiness.  Nausea and vomiting. HOME CARE INSTRUCTIONS  For the first 24 hours after general anesthesia:  Have a responsible person with you.  Do not drive a car. If you are alone, do not take public transportation.  Do not drink alcohol.  Do not take medicine that has not been prescribed by your health care provider.  Do not sign important papers or make important decisions.  You may resume a normal diet and activities as directed by your health care provider.  Change bandages (dressings) as directed.  If you have questions or problems that seem related to general anesthesia, call the hospital and ask for the anesthetist or anesthesiologist on call. SEEK MEDICAL CARE IF:  You have nausea and vomiting that continue the day after anesthesia.  You develop a rash. SEEK IMMEDIATE MEDICAL CARE IF:   You have difficulty breathing.  You have chest pain.  You have any allergic problems. Document Released: 11/16/2000 Document Revised: 08/15/2013 Document Reviewed: 02/23/2013 [facility] Patient Information 2015 [place], [place]. This information is not intended to replace advice given to you by your health care provider. Make sure you discuss any questions you have with your health care provider.

## 2015-01-01 NOTE — Anesthesia Postprocedure Evaluation (Signed)
  Anesthesia Post-op Note  Patient: Linda Sanchez  Procedure(s) Performed: Procedure(s): CYSTOSCOPY/HYDRODISTENSION (N/A)  Anesthesia type:General  Patient location: PACU  Post pain: Pain level controlled  Post assessment: Post-op Vital signs reviewed, Patient's Cardiovascular Status Stable, Respiratory Function Stable, Patent Airway and No signs of Nausea or vomiting  Post vital signs: Reviewed and stable  Last Vitals:  Filed Vitals:   01/01/15 1415  BP:   Pulse: 68  Temp:   Resp: 14    Level of consciousness: awake, alert  and patient cooperative  Complications: No apparent anesthesia complications

## 2015-01-01 NOTE — Anesthesia Procedure Notes (Signed)
Procedure Name: Intubation Date/Time: 01/01/2015 1:16 PM Performed by: Dionne Bucy Patient Re-evaluated:Patient Re-evaluated prior to inductionOxygen Delivery Method: Circle system utilized Preoxygenation: Pre-oxygenation with 100% oxygen Intubation Type: IV induction Ventilation: Mask ventilation without difficulty Laryngoscope Size: Mac and 3 Grade View: Grade II Tube type: Oral Tube size: 7.0 mm Number of attempts: 1 Airway Equipment and Method: Stylet Placement Confirmation: ETT inserted through vocal cords under direct vision,  positive ETCO2 and breath sounds checked- equal and bilateral Secured at: 21 cm Tube secured with: Tape Dental Injury: Teeth and Oropharynx as per pre-operative assessment

## 2015-01-01 NOTE — H&P (Signed)
   History reviewed, patient examined, no change in status, stable for surgery.  

## 2015-01-03 ENCOUNTER — Encounter: Payer: Self-pay | Admitting: Urology

## 2015-08-12 ENCOUNTER — Other Ambulatory Visit: Payer: Self-pay | Admitting: Otolaryngology

## 2016-03-04 ENCOUNTER — Other Ambulatory Visit
Admission: RE | Admit: 2016-03-04 | Discharge: 2016-03-04 | Disposition: A | Discharge status: Home / Self Care | Payer: BLUE CROSS/BLUE SHIELD | Source: Ambulatory Visit | Attending: Physician Assistant | Admitting: Physician Assistant

## 2016-03-04 ENCOUNTER — Other Ambulatory Visit (HOSPITAL_COMMUNITY)
Admission: RE | Admit: 2016-03-04 | Discharge: 2016-03-04 | Disposition: A | Discharge status: Home / Self Care | Payer: BLUE CROSS/BLUE SHIELD | Source: Intra-hospital | Attending: Physician Assistant | Admitting: Physician Assistant

## 2016-03-04 DIAGNOSIS — R197 Diarrhea, unspecified: Secondary | ICD-10-CM | POA: Insufficient documentation

## 2016-03-04 LAB — GASTROINTESTINAL PANEL BY PCR, STOOL (REPLACES STOOL CULTURE)
ASTROVIRUS: NOT DETECTED
Adenovirus F40/41: NOT DETECTED
Campylobacter species: NOT DETECTED
Cryptosporidium: NOT DETECTED
Cyclospora cayetanensis: NOT DETECTED
E. COLI O157: NOT DETECTED
ENTAMOEBA HISTOLYTICA: NOT DETECTED
ENTEROPATHOGENIC E COLI (EPEC): DETECTED — AB
ENTEROTOXIGENIC E COLI (ETEC): NOT DETECTED
Enteroaggregative E coli (EAEC): NOT DETECTED
Giardia lamblia: NOT DETECTED
NOROVIRUS GI/GII: NOT DETECTED
Plesimonas shigelloides: NOT DETECTED
Rotavirus A: NOT DETECTED
SAPOVIRUS (I, II, IV, AND V): NOT DETECTED
SHIGA LIKE TOXIN PRODUCING E COLI (STEC): NOT DETECTED
Salmonella species: NOT DETECTED
Shigella/Enteroinvasive E coli (EIEC): NOT DETECTED
VIBRIO CHOLERAE: NOT DETECTED
Vibrio species: NOT DETECTED
Yersinia enterocolitica: NOT DETECTED

## 2016-03-04 LAB — C DIFFICILE QUICK SCREEN W PCR REFLEX
C DIFFICILE (CDIFF) INTERP: NOT DETECTED
C DIFFICILE (CDIFF) TOXIN: NEGATIVE
C Diff antigen: NEGATIVE

## 2016-03-06 LAB — FECAL LACTOFERRIN, QUANT

## 2016-03-10 LAB — MISC LABCORP TEST (SEND OUT): Labcorp test code: 123234

## 2016-04-06 ENCOUNTER — Other Ambulatory Visit
Admission: RE | Admit: 2016-04-06 | Discharge: 2016-04-06 | Disposition: A | Discharge status: Home / Self Care | Payer: BLUE CROSS/BLUE SHIELD | Source: Ambulatory Visit | Attending: Student | Admitting: Student

## 2016-04-06 DIAGNOSIS — A498 Other bacterial infections of unspecified site: Secondary | ICD-10-CM | POA: Diagnosis present

## 2016-04-06 LAB — GASTROINTESTINAL PANEL BY PCR, STOOL (REPLACES STOOL CULTURE)
Adenovirus F40/41: NOT DETECTED
Astrovirus: NOT DETECTED
CYCLOSPORA CAYETANENSIS: NOT DETECTED
Campylobacter species: NOT DETECTED
Cryptosporidium: NOT DETECTED
E. COLI O157: NOT DETECTED
ENTAMOEBA HISTOLYTICA: NOT DETECTED
Enteroaggregative E coli (EAEC): NOT DETECTED
Enteropathogenic E coli (EPEC): NOT DETECTED
Enterotoxigenic E coli (ETEC): NOT DETECTED
GIARDIA LAMBLIA: NOT DETECTED
Norovirus GI/GII: NOT DETECTED
Plesimonas shigelloides: NOT DETECTED
Rotavirus A: NOT DETECTED
SALMONELLA SPECIES: NOT DETECTED
SAPOVIRUS (I, II, IV, AND V): NOT DETECTED
SHIGELLA/ENTEROINVASIVE E COLI (EIEC): NOT DETECTED
Shiga like toxin producing E coli (STEC): NOT DETECTED
VIBRIO CHOLERAE: NOT DETECTED
VIBRIO SPECIES: NOT DETECTED
Yersinia enterocolitica: NOT DETECTED

## 2016-04-15 ENCOUNTER — Encounter: Admission: RE | Payer: Self-pay | Source: Ambulatory Visit

## 2016-04-15 ENCOUNTER — Ambulatory Visit
Admission: RE | Admit: 2016-04-15 | Payer: BLUE CROSS/BLUE SHIELD | Source: Ambulatory Visit | Admitting: Unknown Physician Specialty

## 2016-04-15 SURGERY — COLONOSCOPY WITH PROPOFOL
Anesthesia: General

## 2016-06-22 ENCOUNTER — Ambulatory Visit: Admit: 2016-06-22 | Payer: BLUE CROSS/BLUE SHIELD | Admitting: Unknown Physician Specialty

## 2016-06-22 SURGERY — COLONOSCOPY WITH PROPOFOL
Anesthesia: General

## 2016-10-21 ENCOUNTER — Ambulatory Visit: Payer: BLUE CROSS/BLUE SHIELD | Admitting: Anesthesiology

## 2016-10-21 ENCOUNTER — Ambulatory Visit
Admission: RE | Admit: 2016-10-21 | Discharge: 2016-10-21 | Disposition: A | Discharge status: Home / Self Care | Payer: BLUE CROSS/BLUE SHIELD | Source: Ambulatory Visit | Attending: Unknown Physician Specialty | Admitting: Unknown Physician Specialty

## 2016-10-21 ENCOUNTER — Encounter
Admission: RE | Disposition: A | Discharge status: Home / Self Care | Payer: Self-pay | Source: Ambulatory Visit | Attending: Unknown Physician Specialty

## 2016-10-21 DIAGNOSIS — G35 Multiple sclerosis: Secondary | ICD-10-CM | POA: Diagnosis not present

## 2016-10-21 DIAGNOSIS — K648 Other hemorrhoids: Secondary | ICD-10-CM | POA: Insufficient documentation

## 2016-10-21 DIAGNOSIS — Z6841 Body Mass Index (BMI) 40.0 and over, adult: Secondary | ICD-10-CM | POA: Diagnosis not present

## 2016-10-21 DIAGNOSIS — F419 Anxiety disorder, unspecified: Secondary | ICD-10-CM | POA: Insufficient documentation

## 2016-10-21 DIAGNOSIS — Z1211 Encounter for screening for malignant neoplasm of colon: Secondary | ICD-10-CM | POA: Diagnosis not present

## 2016-10-21 DIAGNOSIS — G4730 Sleep apnea, unspecified: Secondary | ICD-10-CM | POA: Diagnosis not present

## 2016-10-21 DIAGNOSIS — I341 Nonrheumatic mitral (valve) prolapse: Secondary | ICD-10-CM | POA: Diagnosis not present

## 2016-10-21 DIAGNOSIS — K219 Gastro-esophageal reflux disease without esophagitis: Secondary | ICD-10-CM | POA: Diagnosis not present

## 2016-10-21 DIAGNOSIS — G894 Chronic pain syndrome: Secondary | ICD-10-CM | POA: Diagnosis not present

## 2016-10-21 DIAGNOSIS — Z86010 Personal history of colonic polyps: Secondary | ICD-10-CM | POA: Diagnosis not present

## 2016-10-21 DIAGNOSIS — I10 Essential (primary) hypertension: Secondary | ICD-10-CM | POA: Diagnosis not present

## 2016-10-21 DIAGNOSIS — K449 Diaphragmatic hernia without obstruction or gangrene: Secondary | ICD-10-CM | POA: Insufficient documentation

## 2016-10-21 DIAGNOSIS — R1310 Dysphagia, unspecified: Secondary | ICD-10-CM | POA: Diagnosis not present

## 2016-10-21 DIAGNOSIS — F329 Major depressive disorder, single episode, unspecified: Secondary | ICD-10-CM | POA: Insufficient documentation

## 2016-10-21 DIAGNOSIS — D120 Benign neoplasm of cecum: Secondary | ICD-10-CM | POA: Insufficient documentation

## 2016-10-21 DIAGNOSIS — D123 Benign neoplasm of transverse colon: Secondary | ICD-10-CM | POA: Insufficient documentation

## 2016-10-21 DIAGNOSIS — K319 Disease of stomach and duodenum, unspecified: Secondary | ICD-10-CM | POA: Diagnosis not present

## 2016-10-21 DIAGNOSIS — E669 Obesity, unspecified: Secondary | ICD-10-CM | POA: Insufficient documentation

## 2016-10-21 HISTORY — PX: ESOPHAGOGASTRODUODENOSCOPY (EGD) WITH PROPOFOL: SHX5813

## 2016-10-21 HISTORY — PX: COLONOSCOPY WITH PROPOFOL: SHX5780

## 2016-10-21 SURGERY — COLONOSCOPY WITH PROPOFOL
Anesthesia: General

## 2016-10-21 MED ORDER — MIDAZOLAM HCL 2 MG/2ML IJ SOLN
INTRAMUSCULAR | Status: AC
  Filled 2016-10-21: qty 2

## 2016-10-21 MED ORDER — VANCOMYCIN HCL IN DEXTROSE 1-5 GM/200ML-% IV SOLN: 1000.0000 mg | Freq: Once | INTRAVENOUS | Status: DC

## 2016-10-21 MED ORDER — PROPOFOL 10 MG/ML IV BOLUS
INTRAVENOUS | Status: DC | PRN
  Administered 2016-10-21 (×4): 61.3 mg via INTRAVENOUS

## 2016-10-21 MED ORDER — SODIUM CHLORIDE 0.9 % IV SOLN: INTRAVENOUS | Status: DC

## 2016-10-21 MED ORDER — GENTAMICIN IN SALINE 1-0.9 MG/ML-% IV SOLN: 100.0000 mg | Freq: Once | INTRAVENOUS | Status: DC

## 2016-10-21 MED ORDER — LIDOCAINE HCL (PF) 1 % IJ SOLN
2.0000 mL | Freq: Once | INTRAMUSCULAR | Status: AC
  Administered 2016-10-21: 0.3 mL via INTRADERMAL

## 2016-10-21 MED ORDER — PIPERACILLIN-TAZOBACTAM 3.375 G IVPB 30 MIN
3.3750 g | Freq: Once | INTRAVENOUS | Status: AC
  Administered 2016-10-21: 3.375 g via INTRAVENOUS
  Filled 2016-10-21: qty 50

## 2016-10-21 MED ORDER — PROPOFOL 500 MG/50ML IV EMUL
INTRAVENOUS | Status: DC | PRN
Start: 2016-10-21 — End: 2016-10-21
  Administered 2016-10-21: 120 ug/kg/min via INTRAVENOUS

## 2016-10-21 MED ORDER — SODIUM CHLORIDE 0.9 % IV SOLN
INTRAVENOUS | Status: DC
  Administered 2016-10-21: 1000 mL via INTRAVENOUS

## 2016-10-21 MED ORDER — LIDOCAINE HCL (PF) 2 % IJ SOLN
INTRAMUSCULAR | Status: AC
  Filled 2016-10-21: qty 2

## 2016-10-21 MED ORDER — LIDOCAINE HCL (PF) 1 % IJ SOLN
INTRAMUSCULAR | Status: AC
  Administered 2016-10-21: 0.3 mL via INTRADERMAL
  Filled 2016-10-21: qty 2

## 2016-10-21 MED ORDER — PROPOFOL 500 MG/50ML IV EMUL
INTRAVENOUS | Status: AC
  Filled 2016-10-21: qty 50

## 2016-10-21 MED ORDER — FENTANYL CITRATE (PF) 100 MCG/2ML IJ SOLN
INTRAMUSCULAR | Status: DC | PRN
Start: 2016-10-21 — End: 2016-10-21
  Administered 2016-10-21: 50 ug via INTRAVENOUS

## 2016-10-21 MED ORDER — MIDAZOLAM HCL 5 MG/5ML IJ SOLN
INTRAMUSCULAR | Status: DC | PRN
  Administered 2016-10-21: 1 mg via INTRAVENOUS

## 2016-10-21 MED ORDER — FENTANYL CITRATE (PF) 100 MCG/2ML IJ SOLN
INTRAMUSCULAR | Status: AC
  Filled 2016-10-21: qty 2

## 2016-10-21 NOTE — Transfer of Care (Signed)
Immediate Anesthesia Transfer of Care Note  Patient: Linda Sanchez  Procedure(s) Performed: Procedure(s): COLONOSCOPY WITH PROPOFOL (N/A) ESOPHAGOGASTRODUODENOSCOPY (EGD) WITH PROPOFOL (N/A)  Patient Location: PACU and Endoscopy Unit  Anesthesia Type:General  Level of Consciousness: sedated  Airway & Oxygen Therapy: Patient Spontanous Breathing and Patient connected to nasal cannula oxygen  Post-op Assessment: Report given to RN and Post -op Vital signs reviewed and stable  Post vital signs: stable  Last Vitals:  Vitals:   10/21/16 0906  BP: (!) 168/89  Pulse: 82  Resp: (!) 22  Temp: 36.5 C    Last Pain:  Vitals:   10/21/16 0906  TempSrc: Tympanic         Complications: No apparent anesthesia complications

## 2016-10-21 NOTE — Anesthesia Postprocedure Evaluation (Signed)
Anesthesia Post Note  Patient: Linda Sanchez  Procedure(s) Performed: Procedure(s) (LRB): COLONOSCOPY WITH PROPOFOL (N/A) ESOPHAGOGASTRODUODENOSCOPY (EGD) WITH PROPOFOL (N/A)  Patient location during evaluation: Endoscopy Anesthesia Type: General Level of consciousness: awake and alert Pain management: pain level controlled Vital Signs Assessment: post-procedure vital signs reviewed and stable Respiratory status: spontaneous breathing and respiratory function stable Cardiovascular status: stable Anesthetic complications: no     Last Vitals:  Vitals:   10/21/16 0906  BP: (!) 168/89  Pulse: 82  Resp: (!) 22  Temp: 36.5 C    Last Pain:  Vitals:   10/21/16 0906  TempSrc: Tympanic                 KEPHART,WILLIAM K

## 2016-10-21 NOTE — Op Note (Signed)
[facility] Patient Name: Melvin Primeaux Procedure Date: 10/21/2016 9:59 AM MRN: [id number] Account #: [id number] Date of Birth: Jul 20, 1961 Admit Type: Outpatient Age: 56 Room: [facility] ENDO ROOM 4 Gender: Female Note Status: Finalized Procedure:            Upper GI endoscopy Indications:          Dysphagia, Heartburn Providers:            Manya Silvas, MD Medicines:            Propofol per Anesthesia Complications:        No immediate complications. Procedure:            Pre-Anesthesia Assessment:                       - After reviewing the risks and benefits, the patient                        was deemed in satisfactory condition to undergo the                        procedure.                       After obtaining informed consent, the endoscope was                        passed under direct vision. Throughout the procedure,                        the patient's blood pressure, pulse, and oxygen                        saturations were monitored continuously. The                        Colonoscope was introduced through the mouth, and                        advanced to the second part of duodenum. The upper GI                        endoscopy was accomplished without difficulty. The                        patient tolerated the procedure well. Findings:      The examined esophagus was normal. GEJ 40cm.      A small hiatal hernia was present.      Striped mildly erythematous mucosa without bleeding was found in the       gastric antrum. Biopsies were taken with a cold forceps for histology.       Biopsies were taken with a cold forceps for Helicobacter pylori testing.      The examined duodenum was normal. Impression:           - Normal esophagus.                       - Small hiatal hernia.                       - Erythematous mucosa in the antrum. Biopsied.                       -  Normal examined duodenum. Recommendation:       - Await  pathology results. Manya Silvas, MD 10/21/2016 10:25:48 AM This report has been signed electronically. Number of Addenda: 0 Note Initiated On: 10/21/2016 9:59 AM      [facility]

## 2016-10-21 NOTE — Anesthesia Post-op Follow-up Note (Signed)
Anesthesia QCDR form completed.        

## 2016-10-21 NOTE — Anesthesia Preprocedure Evaluation (Signed)
Anesthesia Evaluation  Patient identified by MRN, date of birth, ID band Patient awake    Reviewed: Allergy & Precautions, NPO status , Patient's Chart, lab work & pertinent test results  History of Anesthesia Complications (+) PONV  Airway Mallampati: III       Dental   Pulmonary sleep apnea (no longer using CPAP) ,           Cardiovascular hypertension, Pt. on medications + Valvular Problems/Murmurs (murmur)      Neuro/Psych Anxiety Depression  Neuromuscular disease (MS)    GI/Hepatic Neg liver ROS, GERD  Medicated and Controlled,  Endo/Other  negative endocrine ROS  Renal/GU negative Renal ROS     Musculoskeletal   Abdominal   Peds  Hematology  (+) anemia ,   Anesthesia Other Findings   Reproductive/Obstetrics                            Anesthesia Physical Anesthesia Plan  ASA: III  Anesthesia Plan: General   Post-op Pain Management:    Induction: Intravenous  Airway Management Planned:   Additional Equipment:   Intra-op Plan:   Post-operative Plan:   Informed Consent: I have reviewed the patients History and Physical, chart, labs and discussed the procedure including the risks, benefits and alternatives for the proposed anesthesia with the patient or authorized representative who has indicated his/her understanding and acceptance.     Plan Discussed with:   Anesthesia Plan Comments:         Anesthesia Quick Evaluation

## 2016-10-21 NOTE — Op Note (Signed)
[facility] Gastroenterology Patient Name: Linda Sanchez Procedure Date: 10/21/2016 9:58 AM MRN: [id number] Account #: [id number] Date of Birth: 1961-06-15 Admit Type: Outpatient Age: 56 Room: [facility] ENDO ROOM 4 Gender: Female Note Status: Finalized Procedure:            Colonoscopy Indications:          High risk colon cancer surveillance: Personal history                        of colonic polyps Providers:            Manya Silvas, MD Medicines:            Propofol per Anesthesia Complications:        No immediate complications. Procedure:            Pre-Anesthesia Assessment:                       - After reviewing the risks and benefits, the patient                        was deemed in satisfactory condition to undergo the                        procedure.                       After obtaining informed consent, the colonoscope was                        passed under direct vision. Throughout the procedure,                        the patient's blood pressure, pulse, and oxygen                        saturations were monitored continuously. The                        Colonoscope was introduced through the anus and                        advanced to the the cecum, identified by appendiceal                        orifice and ileocecal valve. The colonoscopy was                        performed without difficulty. The patient tolerated the                        procedure well. The quality of the bowel preparation                        was good. Findings:      Three sessile polyps were found in the cecum. The polyps were small in       size. These polyps were removed with a cold snare. Resection and       retrieval were complete.      Internal hemorrhoids were found during endoscopy. The hemorrhoids were       small and Grade I (internal  hemorrhoids that do not prolapse).      The exam was otherwise without abnormality. Impression:           - Three small  polyps in the cecum, removed with a cold                        snare. Resected and retrieved.                       - Internal hemorrhoids.                       - The examination was otherwise normal. Recommendation:       - Await pathology results. Manya Silvas, MD 10/21/2016 11:00:48 AM This report has been signed electronically. Number of Addenda: 0 Note Initiated On: 10/21/2016 9:58 AM Scope Withdrawal Time: 0 hours 15 minutes 43 seconds  Total Procedure Duration: 0 hours 29 minutes 30 seconds       [employer]

## 2016-10-21 NOTE — H&P (Signed)
Primary Care Physician:  Daylene Posey, [place] Primary Gastroenterologist:  Dr. Vira Agar  Pre-Procedure History & Physical: HPI:  Linda Sanchez is a 56 y.o. female is here for an endoscopy and colonoscopy.   Past Medical History:  Diagnosis Date  . Anemia   . Anxiety   . Chronic pain syndrome   . Depression   . GERD (gastroesophageal reflux disease)   . Heart murmur   . Interstitial cystitis   . Menopausal symptoms   . Mitral valve prolapse   . Multiple sclerosis    right side weakness/ uses cane occassionally  . MVP (mitral valve prolapse)    Pt states she was told this in the past but an Echo from [facility] in 2015 said pt did not have MVP  . Neuromuscular disorder   . Obesity (BMI 30-39.9)   . Palpitations   . PONV (postoperative nausea and vomiting)   . Sleep apnea    lost weight and does not use cpap anymore  . Vitamin D deficiency     Past Surgical History:  Procedure Laterality Date  . ABDOMINAL HYSTERECTOMY    . APPENDECTOMY    . BLADDER SUSPENSION    . BREAST SURGERY    . CHOLECYSTECTOMY    . CYSTO WITH HYDRODISTENSION N/A 01/01/2015   Procedure: CYSTOSCOPY/HYDRODISTENSION;  Surgeon: Royston Cowper, MD;  Location: [facility] ORS;  Service: Urology;  Laterality: N/A;  . JOINT REPLACEMENT Right   . TUBAL LIGATION      Prior to Admission medications   Medication Sig Start Date End Date Taking? Authorizing Provider  amoxicillin (AMOXIL) 500 MG capsule Take 500 mg by mouth once. Take 4 capsules orally 1 hour prior to appt   Yes Historical Provider, MD  ergocalciferol (VITAMIN D2) 50000 units capsule Take 50,000 Units by mouth once a week.   Yes Historical Provider, MD  hydrochlorothiazide (HYDRODIURIL) 25 MG tablet Take 25 mg by mouth daily.   Yes Historical Provider, MD  hydrOXYzine (ATARAX/VISTARIL) 50 MG tablet Take 50 mg by mouth 3 (three) times daily as needed for itching.   Yes Historical Provider, MD  hyoscyamine (LEVSIN SL) 0.125 MG SL tablet Place 0.125 mg under  the tongue every 4 (four) hours as needed for cramping.   Yes Historical Provider, MD  Menthol, Topical Analgesic, (ZIMS MAX-FREEZE) 3.7 % GEL Apply topically.   Yes Historical Provider, MD  mometasone (ELOCON) 0.1 % lotion Apply 4 drops topically daily. Instill 4 drops into each ear qhs prn for dry skin and itching   Yes Historical Provider, MD  tiZANidine (ZANAFLEX) 2 MG tablet Take 2 mg by mouth at bedtime. Take 1 or 2 tablets in the evening for muscle spasms   Yes Historical Provider, MD  Alum Hydroxide-Mag Carbonate (GAVISCON PO) Take 1 Dose by mouth as needed (acid relief).    Historical Provider, MD  baclofen (LIORESAL) 10 MG tablet Take 1 tablet by mouth 4 (four) times daily. 11/27/14   Historical Provider, MD  BIOTIN PO Take 1 tablet by mouth daily.    Historical Provider, MD  Cholecalciferol (VITAMIN D3) 5000 UNITS TABS Take 1 tablet by mouth daily.    Historical Provider, MD  clonazePAM (KLONOPIN) 1 MG tablet Take 1 mg by mouth 2 (two) times daily.    Historical Provider, MD  dexlansoprazole (DEXILANT) 60 MG capsule Take 60 mg by mouth daily.    Historical Provider, MD  diclofenac sodium (VOLTAREN) 1 % GEL Apply 2 g topically at bedtime.    Historical  Provider, MD  diphenhydrAMINE (BENADRYL) 25 MG tablet Take 25 mg by mouth at bedtime.    Historical Provider, MD  doxycycline (PERIOSTAT) 20 MG tablet Take 1 tablet by mouth 2 (two) times daily as needed (Yeast infection in Scalp).  11/16/14   Historical Provider, MD  ESTRACE VAGINAL 0.1 MG/GM vaginal cream Place 1 application vaginally 2 (two) times a week. 10/30/14   Historical Provider, MD  Fingolimod HCl 0.5 MG CAPS Take 1 capsule by mouth daily.    Historical Provider, MD  fluconazole (DIFLUCAN) 150 MG tablet Take 150 mg by mouth daily as needed (Only takes with antibiotics).  08/10/13   Historical Provider, MD  fluconazole (DIFLUCAN) 200 MG tablet Take 1 tablet (200 mg total) by mouth daily. 01/01/15   Royston Cowper, MD  fluticasone  (FLONASE) 50 MCG/ACT nasal spray Place 2 sprays into both nostrils daily as needed for allergies.     Historical Provider, MD  Meth-Hyo-M Bl-Na Phos-Ph Sal (URIBEL) 118 MG CAPS Take 1 capsule by mouth every 4 (four) hours as needed (bladder spasms).     Historical Provider, MD  Multiple Vitamins-Minerals (MULTIVITAMIN WITH MINERALS) tablet Take 1 tablet by mouth daily.    Historical Provider, MD  pentosan polysulfate (ELMIRON) 100 MG capsule Take 100 mg by mouth 3 (three) times daily.    Historical Provider, MD  phentermine 30 MG capsule Take 1 capsule by mouth every morning. 11/29/14   Historical Provider, MD  promethazine (PHENERGAN) 25 MG tablet Take 25 mg by mouth every 6 (six) hours as needed for nausea or vomiting.    Historical Provider, MD  pseudoephedrine (SUDAFED) 120 MG 12 hr tablet Take 120 mg by mouth daily as needed for congestion.    Historical Provider, MD  Trospium Chloride 60 MG CP24 Take 1 capsule by mouth daily.     Historical Provider, MD  venlafaxine XR (EFFEXOR-XR) 75 MG 24 hr capsule Take 75 mg by mouth daily with breakfast.    Historical Provider, MD  vitamin B-12 (CYANOCOBALAMIN) 1000 MCG tablet Take 1,000 mcg by mouth daily.    Historical Provider, MD    Allergies as of 09/18/2016 - Review Complete 01/01/2015  Allergen Reaction Noted  . Chocolate flavor Anaphylaxis 10/03/2013  . Biaxin [clarithromycin]  10/03/2013  . Ciprofloxacin  10/03/2013  . Ibuprofen Other (See Comments) 12/25/2014  . Morphine and related Nausea And Vomiting 10/03/2013  . Sulfa antibiotics  10/03/2013  . Floxin [ofloxacin] Rash 12/31/2014    No family history on file.  Social History   Social History  . Marital status: Married    Spouse name: N/A  . Number of children: N/A  . Years of education: N/A   Occupational History  . Not on file.   Social History Main Topics  . Smoking status: Never Smoker  . Smokeless tobacco: Not on file  . Alcohol use No  . Drug use: No  . Sexual  activity: Not on file   Other Topics Concern  . Not on file   Social History Narrative  . No narrative on file    Review of Systems: See HPI, otherwise negative ROS  Physical Exam: BP (!) 168/89   Pulse 82   Temp 97.7 F (36.5 C) (Tympanic)   Resp (!) 22   Ht 5\' 8"  (1.727 m)   Wt 122.5 kg (270 lb)   SpO2 100%   BMI 41.05 kg/m  General:   Alert,  pleasant and cooperative in NAD Head:  Normocephalic and atraumatic.  Neck:  Supple; no masses or thyromegaly. Lungs:  Clear throughout to auscultation.    Heart:  Regular rate and rhythm. Abdomen:  Soft, nontender and nondistended. Normal bowel sounds, without guarding, and without rebound.   Neurologic:  Alert and  oriented x4;  grossly normal neurologically.  Impression/Plan: Linda Sanchez is here for an endoscopy and colonoscopy to be performed for personal history of colon polyps and dysphagia with heartburn.  Risks, benefits, limitations, and alternatives regarding  endoscopy and colonoscopy have been reviewed with the patient.  Questions have been answered.  All parties agreeable.   Gaylyn Cheers, MD  10/21/2016, 10:03 AM

## 2016-10-22 ENCOUNTER — Encounter: Payer: Self-pay | Admitting: Unknown Physician Specialty

## 2016-10-23 LAB — SURGICAL PATHOLOGY

## 2017-05-04 ENCOUNTER — Other Ambulatory Visit (HOSPITAL_BASED_OUTPATIENT_CLINIC_OR_DEPARTMENT_OTHER): Payer: Self-pay | Admitting: Nurse Practitioner

## 2017-05-04 ENCOUNTER — Ambulatory Visit (HOSPITAL_BASED_OUTPATIENT_CLINIC_OR_DEPARTMENT_OTHER)
Admission: RE | Admit: 2017-05-04 | Discharge: 2017-05-04 | Disposition: A | Discharge status: Home / Self Care | Payer: BLUE CROSS/BLUE SHIELD | Source: Ambulatory Visit | Attending: Nurse Practitioner | Admitting: Nurse Practitioner

## 2017-05-04 DIAGNOSIS — R600 Localized edema: Secondary | ICD-10-CM | POA: Diagnosis not present

## 2018-12-23 ENCOUNTER — Other Ambulatory Visit
Admission: RE | Admit: 2018-12-23 | Discharge: 2018-12-23 | Disposition: A | Discharge status: Home / Self Care | Payer: BLUE CROSS/BLUE SHIELD | Source: Ambulatory Visit | Attending: Student | Admitting: Student

## 2018-12-23 DIAGNOSIS — K529 Noninfective gastroenteritis and colitis, unspecified: Secondary | ICD-10-CM | POA: Insufficient documentation

## 2018-12-23 DIAGNOSIS — R1011 Right upper quadrant pain: Secondary | ICD-10-CM | POA: Insufficient documentation

## 2018-12-23 LAB — C DIFFICILE QUICK SCREEN W PCR REFLEX??
C Diff antigen: NEGATIVE
C Diff toxin: NEGATIVE

## 2018-12-23 LAB — C DIFFICILE QUICK SCREEN W PCR REFLEX: C Diff interpretation: NOT DETECTED

## 2018-12-25 LAB — H. PYLORI ANTIGEN, STOOL: H. Pylori Stool Ag, Eia: NEGATIVE

## 2018-12-26 ENCOUNTER — Other Ambulatory Visit: Payer: Self-pay

## 2018-12-26 ENCOUNTER — Other Ambulatory Visit (HOSPITAL_BASED_OUTPATIENT_CLINIC_OR_DEPARTMENT_OTHER): Payer: Self-pay | Admitting: Student

## 2018-12-26 ENCOUNTER — Other Ambulatory Visit: Payer: Self-pay | Admitting: Student

## 2018-12-26 ENCOUNTER — Ambulatory Visit (HOSPITAL_BASED_OUTPATIENT_CLINIC_OR_DEPARTMENT_OTHER)
Admission: RE | Admit: 2018-12-26 | Discharge: 2018-12-26 | Disposition: A | Discharge status: Home / Self Care | Payer: BLUE CROSS/BLUE SHIELD | Source: Ambulatory Visit | Attending: Student | Admitting: Student

## 2018-12-26 DIAGNOSIS — K529 Noninfective gastroenteritis and colitis, unspecified: Secondary | ICD-10-CM

## 2018-12-26 DIAGNOSIS — R1011 Right upper quadrant pain: Secondary | ICD-10-CM

## 2018-12-26 LAB — CALPROTECTIN, FECAL: Calprotectin, Fecal: 22 ug/g (ref 0–120)

## 2018-12-26 MED ORDER — IOHEXOL 300 MG/ML  SOLN
100.0000 mL | Freq: Once | INTRAMUSCULAR | Status: AC | PRN
  Administered 2018-12-26: 14:00:00 100 mL via INTRAVENOUS

## 2018-12-27 LAB — PANCREATIC ELASTASE, FECAL: Pancreatic Elastase-1, Stool: 433 ug Elast./g (ref >200)

## 2019-02-02 ENCOUNTER — Other Ambulatory Visit
Admission: RE | Admit: 2019-02-02 | Discharge: 2019-02-02 | Disposition: A | Discharge status: Home / Self Care | Payer: BC Managed Care – PPO | Source: Ambulatory Visit | Attending: Unknown Physician Specialty | Admitting: Unknown Physician Specialty

## 2019-02-02 ENCOUNTER — Other Ambulatory Visit: Payer: Self-pay

## 2019-02-02 DIAGNOSIS — Z1159 Encounter for screening for other viral diseases: Secondary | ICD-10-CM | POA: Diagnosis not present

## 2019-02-03 LAB — NOVEL CORONAVIRUS, NAA (HOSP ORDER, SEND-OUT TO REF LAB; TAT 18-24 HRS): SARS-CoV-2, NAA: NOT DETECTED

## 2019-02-06 ENCOUNTER — Ambulatory Visit: Payer: BC Managed Care – PPO | Admitting: Anesthesiology

## 2019-02-06 ENCOUNTER — Encounter
Admission: RE | Disposition: A | Discharge status: Home / Self Care | Payer: Self-pay | Source: Home / Self Care | Attending: Unknown Physician Specialty

## 2019-02-06 ENCOUNTER — Other Ambulatory Visit: Payer: Self-pay

## 2019-02-06 ENCOUNTER — Encounter: Payer: Self-pay | Admitting: *Deleted

## 2019-02-06 ENCOUNTER — Ambulatory Visit
Admission: RE | Admit: 2019-02-06 | Discharge: 2019-02-06 | Disposition: A | Discharge status: Home / Self Care | Payer: BC Managed Care – PPO | Attending: Unknown Physician Specialty | Admitting: Unknown Physician Specialty

## 2019-02-06 DIAGNOSIS — Q438 Other specified congenital malformations of intestine: Secondary | ICD-10-CM | POA: Insufficient documentation

## 2019-02-06 DIAGNOSIS — F329 Major depressive disorder, single episode, unspecified: Secondary | ICD-10-CM | POA: Diagnosis not present

## 2019-02-06 DIAGNOSIS — Z7901 Long term (current) use of anticoagulants: Secondary | ICD-10-CM | POA: Insufficient documentation

## 2019-02-06 DIAGNOSIS — G35 Multiple sclerosis: Secondary | ICD-10-CM | POA: Diagnosis not present

## 2019-02-06 DIAGNOSIS — G4730 Sleep apnea, unspecified: Secondary | ICD-10-CM | POA: Diagnosis not present

## 2019-02-06 DIAGNOSIS — I10 Essential (primary) hypertension: Secondary | ICD-10-CM | POA: Diagnosis not present

## 2019-02-06 DIAGNOSIS — K219 Gastro-esophageal reflux disease without esophagitis: Secondary | ICD-10-CM | POA: Insufficient documentation

## 2019-02-06 DIAGNOSIS — E669 Obesity, unspecified: Secondary | ICD-10-CM | POA: Insufficient documentation

## 2019-02-06 DIAGNOSIS — R1013 Epigastric pain: Secondary | ICD-10-CM | POA: Diagnosis present

## 2019-02-06 DIAGNOSIS — K449 Diaphragmatic hernia without obstruction or gangrene: Secondary | ICD-10-CM | POA: Diagnosis not present

## 2019-02-06 DIAGNOSIS — K529 Noninfective gastroenteritis and colitis, unspecified: Secondary | ICD-10-CM | POA: Diagnosis not present

## 2019-02-06 DIAGNOSIS — E559 Vitamin D deficiency, unspecified: Secondary | ICD-10-CM | POA: Diagnosis not present

## 2019-02-06 DIAGNOSIS — K640 First degree hemorrhoids: Secondary | ICD-10-CM | POA: Diagnosis not present

## 2019-02-06 DIAGNOSIS — F419 Anxiety disorder, unspecified: Secondary | ICD-10-CM | POA: Diagnosis not present

## 2019-02-06 DIAGNOSIS — K317 Polyp of stomach and duodenum: Secondary | ICD-10-CM | POA: Insufficient documentation

## 2019-02-06 DIAGNOSIS — Z79890 Hormone replacement therapy (postmenopausal): Secondary | ICD-10-CM | POA: Insufficient documentation

## 2019-02-06 DIAGNOSIS — Z6841 Body Mass Index (BMI) 40.0 and over, adult: Secondary | ICD-10-CM | POA: Diagnosis not present

## 2019-02-06 DIAGNOSIS — Z79899 Other long term (current) drug therapy: Secondary | ICD-10-CM | POA: Insufficient documentation

## 2019-02-06 HISTORY — PX: ESOPHAGOGASTRODUODENOSCOPY (EGD) WITH PROPOFOL: SHX5813

## 2019-02-06 HISTORY — DX: Essential (primary) hypertension: I10

## 2019-02-06 HISTORY — PX: COLONOSCOPY WITH PROPOFOL: SHX5780

## 2019-02-06 SURGERY — COLONOSCOPY WITH PROPOFOL
Anesthesia: General

## 2019-02-06 MED ORDER — MIDAZOLAM HCL 5 MG/5ML IJ SOLN
INTRAMUSCULAR | Status: DC | PRN
  Administered 2019-02-06: 2 mg via INTRAVENOUS
  Administered 2019-02-06: 1 mg via INTRAVENOUS
  Administered 2019-02-06: 2 mg via INTRAVENOUS
  Administered 2019-02-06: 1 mg via INTRAVENOUS

## 2019-02-06 MED ORDER — MIDAZOLAM HCL 2 MG/2ML IJ SOLN
INTRAMUSCULAR | Status: AC
  Filled 2019-02-06: qty 2

## 2019-02-06 MED ORDER — FENTANYL CITRATE (PF) 100 MCG/2ML IJ SOLN
INTRAMUSCULAR | Status: AC
  Filled 2019-02-06: qty 2

## 2019-02-06 MED ORDER — ONDANSETRON HCL 4 MG/2ML IJ SOLN
INTRAMUSCULAR | Status: DC | PRN
  Administered 2019-02-06: 4 mg via INTRAVENOUS

## 2019-02-06 MED ORDER — PIPERACILLIN-TAZOBACTAM 3.375 G IVPB
INTRAVENOUS | Status: AC
  Administered 2019-02-06: 3.375 g
  Filled 2019-02-06: qty 50

## 2019-02-06 MED ORDER — PROPOFOL 10 MG/ML IV BOLUS
INTRAVENOUS | Status: DC | PRN
  Administered 2019-02-06: 50 mg via INTRAVENOUS

## 2019-02-06 MED ORDER — FENTANYL CITRATE (PF) 100 MCG/2ML IJ SOLN
INTRAMUSCULAR | Status: DC | PRN
  Administered 2019-02-06 (×2): 50 ug via INTRAVENOUS

## 2019-02-06 MED ORDER — ESMOLOL HCL 100 MG/10ML IV SOLN
INTRAVENOUS | Status: AC
  Filled 2019-02-06: qty 10

## 2019-02-06 MED ORDER — LIDOCAINE HCL (PF) 2 % IJ SOLN
INTRAMUSCULAR | Status: DC | PRN
  Administered 2019-02-06: 60 mg

## 2019-02-06 MED ORDER — SODIUM CHLORIDE 0.9 % IV SOLN
INTRAVENOUS | Status: DC
  Administered 2019-02-06: 09:00:00 via INTRAVENOUS

## 2019-02-06 MED ORDER — PIPERACILLIN-TAZOBACTAM 3.375 G IVPB 30 MIN
3.3750 g | Freq: Once | INTRAVENOUS | Status: DC
  Filled 2019-02-06: qty 50

## 2019-02-06 MED ORDER — LIDOCAINE HCL (PF) 2 % IJ SOLN
INTRAMUSCULAR | Status: AC
  Filled 2019-02-06: qty 10

## 2019-02-06 MED ORDER — LABETALOL HCL 5 MG/ML IV SOLN
INTRAVENOUS | Status: AC
  Filled 2019-02-06: qty 4

## 2019-02-06 MED ORDER — ONDANSETRON HCL 4 MG/2ML IJ SOLN
INTRAMUSCULAR | Status: AC
  Filled 2019-02-06: qty 2

## 2019-02-06 MED ORDER — PROPOFOL 500 MG/50ML IV EMUL
INTRAVENOUS | Status: DC | PRN
  Administered 2019-02-06: 75 ug/kg/min via INTRAVENOUS

## 2019-02-06 NOTE — Anesthesia Postprocedure Evaluation (Signed)
Anesthesia Post Note  Patient: DEBRIA BROECKER  Procedure(s) Performed: COLONOSCOPY WITH PROPOFOL (N/A ) ESOPHAGOGASTRODUODENOSCOPY (EGD) WITH PROPOFOL (N/A )  Patient location during evaluation: Endoscopy Anesthesia Type: General Level of consciousness: awake and alert Pain management: pain level controlled Vital Signs Assessment: post-procedure vital signs reviewed and stable Respiratory status: spontaneous breathing and respiratory function stable Cardiovascular status: stable Anesthetic complications: no     Last Vitals:  Vitals:   02/06/19 0828 02/06/19 1123  BP: (!) 152/96 (!) 141/79  Pulse: 98 96  Resp: 16 15  Temp: (!) 36.3 C (!) 36.2 C  SpO2: 99% 100%    Last Pain:  Vitals:   02/06/19 1123  TempSrc: Tympanic  PainSc: 0-No pain                 KEPHART,WILLIAM K

## 2019-02-06 NOTE — Op Note (Signed)
[facility] Patient Name: Linda Sanchez Procedure Date: 02/06/2019 10:37 AM MRN: [id number] Account #: [id number] Date of Birth: 01-16-1961 Admit Type: Outpatient Age: 58 Room: [facility] ENDO ROOM 3 Gender: Female Note Status: Finalized Procedure:            Colonoscopy Indications:          Chronic diarrhea Providers:            Manya Silvas, MD Referring MD:         Daylene Posey (Referring MD) Medicines:            Propofol per Anesthesia Complications:        No immediate complications. Procedure:            Pre-Anesthesia Assessment:                       - After reviewing the risks and benefits, the patient                        was deemed in satisfactory condition to undergo the                        procedure.                       After obtaining informed consent, the colonoscope was                        passed under direct vision. Throughout the procedure,                        the patient's blood pressure, pulse, and oxygen                        saturations were monitored continuously. The was                        introduced through the anus and advanced to the the                        cecum, identified by appendiceal orifice and ileocecal                        valve. The colonoscopy was somewhat difficult due to a                        redundant colon and a tortuous colon. Successful                        completion of the procedure was aided by applying                        abdominal pressure. The patient tolerated the procedure                        well. The quality of the bowel preparation was good. Findings:      Internal hemorrhoids were found during endoscopy. The hemorrhoids were       small and Grade I (internal hemorrhoids that do not prolapse).      Biopsies done of cecum, ascending, transverse. descending, and sigmoid  colon. The mucosa appeared normal throughout the colon.      The entire examined  colon appeared normal. Impression:           - Internal hemorrhoids.                       - The entire examined colon is normal.                       - No specimens collected. Except the mentioned biopsies. Recommendation:       - Await pathology results. Procedure Code(s):    --- Professional ---                       [phone], Colonoscopy, flexible; diagnostic, including                        collection of specimen(s) by brushing or washing, when                        performed (separate procedure) CPT copyright 2019 American [employer] Association. All rights reserved. The codes documented in this report are preliminary and upon coder review may  be revised to meet current compliance requirements. Manya Silvas, MD 02/06/2019 11:24:29 AM This report has been signed electronically. Number of Addenda: 0 Note Initiated On: 02/06/2019 10:37 AM Scope Withdrawal Time: 0 hours 10 minutes 21 seconds  Total Procedure Duration: 0 hours 16 minutes 25 seconds  Estimated Blood Loss: Estimated blood loss: none.      [facility]

## 2019-02-06 NOTE — Op Note (Signed)
[facility]astroenterology Patient Name: Linda Sanchez Procedure Date: 02/06/2019 10:38 AM MRN: [id number] Account #: [id number] Date of Birth: Dec 23, 1960 Admit Type: Outpatient Age: 58 Room: [facility] ENDO ROOM 3 Gender: Female Note Status: Finalized Procedure:            Upper GI endoscopy Indications:          Epigastric abdominal pain Providers:            Manya Silvas, MD Referring MD:         Daylene Posey (Referring MD) Medicines:            Propofol per Anesthesia Complications:        No immediate complications. Procedure:            Pre-Anesthesia Assessment:                       - After reviewing the risks and benefits, the patient                        was deemed in satisfactory condition to undergo the                        procedure.                       After obtaining informed consent, the endoscope was                        passed under direct vision. Throughout the procedure,                        the patient's blood pressure, pulse, and oxygen                        saturations were monitored continuously. The Endoscope                        was introduced through the mouth, and advanced to the                        second part of duodenum. The patient tolerated the                        procedure. The upper GI endoscopy was technically                        difficult and complex due to the patient's discomfort                        during the procedure. Findings:      A small hiatal hernia was present.      A single small sessile polyp with no bleeding and no stigmata of recent       bleeding was found in the gastric body.      The examined duodenum was normal.      The esophagus was normal. Impression:           - Small hiatal hernia.                       - A single gastric polyp.                       -  Normal examined duodenum.                       - No specimens collected. Recommendation:       - Perform a colonoscopy  today. Manya Silvas, MD 02/06/2019 10:55:44 AM This report has been signed electronically. Number of Addenda: 0 Note Initiated On: 02/06/2019 10:38 AM      [facility]

## 2019-02-06 NOTE — Transfer of Care (Signed)
Immediate Anesthesia Transfer of Care Note  Patient: RANAY KETTER  Procedure(s) Performed: COLONOSCOPY WITH PROPOFOL (N/A ) ESOPHAGOGASTRODUODENOSCOPY (EGD) WITH PROPOFOL (N/A )  Patient Location: PACU  Anesthesia Type:General  Level of Consciousness: sedated  Airway & Oxygen Therapy: Patient Spontanous Breathing and Patient connected to nasal cannula oxygen  Post-op Assessment: Report given to RN  Post vital signs: Reviewed and stable  Last Vitals:  Vitals Value Taken Time  BP 141/79 02/06/19 1123  Temp 36.2 C 02/06/19 1123  Pulse 95 02/06/19 1125  Resp 16 02/06/19 1125  SpO2 99 % 02/06/19 1125  Vitals shown include unvalidated device data.  Last Pain:  Vitals:   02/06/19 1123  TempSrc: Tympanic  PainSc: 0-No pain         Complications: No apparent anesthesia complications

## 2019-02-06 NOTE — Anesthesia Preprocedure Evaluation (Signed)
Anesthesia Evaluation  Patient identified by MRN, date of birth, ID band Patient awake    Reviewed: Allergy & Precautions, NPO status , Patient's Chart, lab work & pertinent test results  History of Anesthesia Complications (+) PONV  Airway Mallampati: III       Dental   Pulmonary sleep apnea (not using CPAP any more) , neg COPD,           Cardiovascular hypertension, Pt. on medications (-) Past MI and (-) CHF (-) dysrhythmias + Valvular Problems/Murmurs MVP      Neuro/Psych neg Seizures Anxiety Depression    GI/Hepatic Neg liver ROS, GERD  Medicated,  Endo/Other  neg diabetes  Renal/GU negative Renal ROS     Musculoskeletal   Abdominal   Peds  Hematology  (+) anemia ,   Anesthesia Other Findings   Reproductive/Obstetrics                            Anesthesia Physical Anesthesia Plan  ASA: III  Anesthesia Plan: General   Post-op Pain Management:    Induction: Intravenous  PONV Risk Score and Plan: 4 or greater and Propofol infusion, TIVA and Treatment may vary due to age or medical condition  Airway Management Planned: Nasal Cannula  Additional Equipment:   Intra-op Plan:   Post-operative Plan:   Informed Consent: I have reviewed the patients History and Physical, chart, labs and discussed the procedure including the risks, benefits and alternatives for the proposed anesthesia with the patient or authorized representative who has indicated his/her understanding and acceptance.       Plan Discussed with:   Anesthesia Plan Comments:         Anesthesia Quick Evaluation

## 2019-02-06 NOTE — H&P (Signed)
Primary Care Physician:  Daylene Posey, [place] Primary Gastroenterologist:  Dr. Vira Agar  Pre-Procedure History & Physical: HPI:  Linda Sanchez is a 58 y.o. female is here for an endoscopy and colonoscopy.  Done for RUQ pain and diarrhea.   Past Medical History:  Diagnosis Date  . Anemia   . Anxiety   . Chronic pain syndrome   . Depression   . GERD (gastroesophageal reflux disease)   . Heart murmur   . Hypertension   . Interstitial cystitis   . Menopausal symptoms   . Mitral valve prolapse   . Multiple sclerosis ([place])    right side weakness/ uses cane occassionally  . MVP (mitral valve prolapse)    Pt states she was told this in the past but an Echo from [facility] in 2015 said pt did not have MVP  . Neuromuscular disorder ([place])   . Obesity (BMI 30-39.9)   . Palpitations   . PONV (postoperative nausea and vomiting)   . Sleep apnea    lost weight and does not use cpap anymore  . Vitamin D deficiency     Past Surgical History:  Procedure Laterality Date  . ABDOMINAL HYSTERECTOMY    . APPENDECTOMY    . BLADDER SUSPENSION    . BREAST SURGERY    . CHOLECYSTECTOMY    . COLONOSCOPY WITH PROPOFOL N/A 10/21/2016   Procedure: COLONOSCOPY WITH PROPOFOL;  Surgeon: Manya Silvas, MD;  Location: [facility] ENDOSCOPY;  Service: Endoscopy;  Laterality: N/A;  . CYSTO WITH HYDRODISTENSION N/A 01/01/2015   Procedure: CYSTOSCOPY/HYDRODISTENSION;  Surgeon: Royston Cowper, MD;  Location: [facility] ORS;  Service: Urology;  Laterality: N/A;  . ESOPHAGOGASTRODUODENOSCOPY (EGD) WITH PROPOFOL N/A 10/21/2016   Procedure: ESOPHAGOGASTRODUODENOSCOPY (EGD) WITH PROPOFOL;  Surgeon: Manya Silvas, MD;  Location: [facility] ENDOSCOPY;  Service: Endoscopy;  Laterality: N/A;  . JOINT REPLACEMENT Right   . TUBAL LIGATION      Prior to Admission medications   Medication Sig Start Date End Date Taking? Authorizing Provider  atorvastatin (LIPITOR) 20 MG tablet Take 20 mg by mouth daily.   Yes [provider]   clonazePAM (KLONOPIN) 1 MG tablet Take 1 mg by mouth 2 (two) times daily.   Yes [provider]  esomeprazole (NEXIUM) 40 MG capsule Take 40 mg by mouth daily at 12 noon.   Yes [provider]  hydrochlorothiazide (HYDRODIURIL) 25 MG tablet Take 25 mg by mouth daily.   Yes [provider]  pentosan polysulfate (ELMIRON) 100 MG capsule Take 100 mg by mouth 3 (three) times daily.   Yes [provider]  rivaroxaban (XARELTO) 20 MG TABS tablet Take 20 mg by mouth daily with supper.   Yes [provider]  Alum Hydroxide-Mag Carbonate (GAVISCON PO) Take 1 Dose by mouth as needed (acid relief).    [provider]  amoxicillin (AMOXIL) 500 MG capsule Take 500 mg by mouth once. Take 4 capsules orally 1 hour prior to appt    [provider]  baclofen (LIORESAL) 10 MG tablet Take 1 tablet by mouth 4 (four) times daily. 11/27/14   [provider]  BIOTIN PO Take 1 tablet by mouth daily.    [provider]  Cholecalciferol (VITAMIN D3) 5000 UNITS TABS Take 1 tablet by mouth daily.    [provider]  dexlansoprazole (DEXILANT) 60 MG capsule Take 60 mg by mouth daily.    [provider]  diclofenac sodium (VOLTAREN) 1 % GEL Apply 2 g topically at  bedtime.    [provider]  diphenhydrAMINE (BENADRYL) 25 MG tablet Take 25 mg by mouth at bedtime.    [provider]  doxycycline (PERIOSTAT) 20 MG tablet Take 1 tablet by mouth 2 (two) times daily as needed (Yeast infection in [place]).  11/16/14   [provider]  ergocalciferol (VITAMIN D2) 50000 units capsule Take 50,000 Units by mouth once a week.    [provider]  ESTRACE VAGINAL 0.1 MG/GM vaginal cream Place 1 application vaginally 2 (two) times a week. 10/30/14   [provider]  Fingolimod HCl 0.5 MG CAPS Take 1 capsule by mouth daily.    [provider]  fluconazole (DIFLUCAN) 150 MG tablet Take 150 mg by mouth  daily as needed (Only takes with antibiotics).  08/10/13   [provider]  fluconazole (DIFLUCAN) 200 MG tablet Take 1 tablet (200 mg total) by mouth daily. 01/01/15   Royston Cowper, MD  fluticasone (FLONASE) 50 MCG/ACT nasal spray Place 2 sprays into both nostrils daily as needed for allergies.     [provider]  hydrOXYzine (ATARAX/VISTARIL) 50 MG tablet Take 50 mg by mouth 3 (three) times daily as needed for itching.    [provider]  hyoscyamine (LEVSIN SL) 0.125 MG SL tablet Place 0.125 mg under the tongue every 4 (four) hours as needed for cramping.    [provider]  Menthol, Topical Analgesic, (ZIMS MAX-FREEZE) 3.7 % GEL Apply topically.    [provider]  Meth-Hyo-M Bl-Na Phos-Ph Sal (URIBEL) 118 MG CAPS Take 1 capsule by mouth every 4 (four) hours as needed (bladder spasms).     [provider]  mometasone (ELOCON) 0.1 % lotion Apply 4 drops topically daily. Instill 4 drops into each ear qhs prn for dry skin and itching    [provider]  Multiple Vitamins-Minerals (MULTIVITAMIN WITH MINERALS) tablet Take 1 tablet by mouth daily.    [provider]  phentermine 30 MG capsule Take 1 capsule by mouth every morning. 11/29/14   [provider]  promethazine (PHENERGAN) 25 MG tablet Take 25 mg by mouth every 6 (six) hours as needed for nausea or vomiting.    [provider]  pseudoephedrine (SUDAFED) 120 MG 12 hr tablet Take 120 mg by mouth daily as needed for congestion.    [provider]  tiZANidine (ZANAFLEX) 2 MG tablet Take 2 mg by mouth at bedtime. Take 1 or 2 tablets in the evening for muscle spasms    [provider]  Trospium Chloride 60 MG CP24 Take 1 capsule by mouth daily.     [provider]  venlafaxine XR (EFFEXOR-XR) 75 MG 24 hr capsule Take 75 mg by mouth daily with breakfast.    [provider]  vitamin B-12 (CYANOCOBALAMIN) 1000 MCG tablet Take  1,000 mcg by mouth daily.    [provider]    Allergies as of 01/27/2019 - Review Complete 12/26/2018  Allergen Reaction Noted  . Amoxicillin-pot clavulanate Nausea Only 10/20/2016  . Biaxin [clarithromycin] Photosensitivity 10/03/2013  . Buprenorphine hcl Nausea And Vomiting 10/20/2016  . Ciprofloxacin  10/03/2013  . Ibuprofen Other (See Comments) 12/25/2014  . Macrobid [nitrofurantoin macrocrystal] Nausea Only 10/20/2016  . Morphine and related Nausea And Vomiting 10/03/2013  . Nsaids  10/20/2016  . Sulfa antibiotics  10/03/2013  . Testim [testosterone]  10/20/2016  . Bactrim [sulfamethoxazole-trimethoprim] Rash 10/20/2016  . Floxin [ofloxacin] Rash 12/31/2014    History reviewed. No pertinent family history.  Social History   Socioeconomic History  . Marital status: Married    Spouse name: Not on file  . Number of children: Not on file  . Years of education: Not on file  . Highest education level: Not on file  Occupational History  . Not on file  Social Needs  . Financial resource strain: Not on file  . Food insecurity    Worry: Not on file    Inability: Not on file  . Transportation needs    Medical: Not on file    Non-medical: Not on file  Tobacco Use  . Smoking status: Never Smoker  . Smokeless tobacco: Never Used  Substance and Sexual Activity  . Alcohol use: No  . Drug use: No  . Sexual activity: Not on file  Lifestyle  . Physical activity    Days per week: Not on file    Minutes per session: Not on file  . Stress: Not on file  Relationships  . Social Herbalist on phone: Not on file    Gets together: Not on file    Attends religious service: Not on file    Active member of club or organization: Not on file    Attends meetings of clubs or organizations: Not on file    Relationship status: Not on file  . Intimate partner violence    Fear of current or ex partner: Not on file    Emotionally abused: Not on file    Physically  abused: Not on file    Forced sexual activity: Not on file  Other Topics Concern  . Not on file  Social History Narrative  . Not on file    Review of Systems: See HPI, otherwise negative ROS  Physical Exam: BP (!) 152/96   Pulse 98   Temp (!) 97.4 F (36.3 C) (Tympanic)   Resp 16   Ht 5\' 7"  (1.702 m)   Wt 129.3 kg   SpO2 99%   BMI 44.64 kg/m  General:   Alert,  pleasant and cooperative in NAD Head:  Normocephalic and atraumatic. Neck:  Supple; no masses or thyromegaly. Lungs:  Clear throughout to auscultation.    Heart:  Regular rate and rhythm. Abdomen:  Soft, nontender and nondistended. Normal bowel sounds, without guarding, and without rebound.   Neurologic:  Alert and  oriented x4;  grossly normal neurologically.  Impression/Plan: NASHONDA LIMBERG is here for an endoscopy and colonoscopy to be performed for RUQ abd pain and diarrhea  Risks, benefits, limitations, and alternatives regarding  endoscopy and colonoscopy have been reviewed with the patient.  Questions have been answered.  All parties agreeable.   Gaylyn Cheers, MD  02/06/2019, 10:31 AM

## 2019-02-06 NOTE — Anesthesia Post-op Follow-up Note (Signed)
Anesthesia QCDR form completed.        

## 2019-02-07 ENCOUNTER — Encounter: Payer: Self-pay | Admitting: Unknown Physician Specialty

## 2019-02-07 LAB — SURGICAL PATHOLOGY

## 2019-10-31 ENCOUNTER — Ambulatory Visit: Payer: Medicare Other | Admitting: Neurology

## 2019-11-16 ENCOUNTER — Ambulatory Visit (INDEPENDENT_AMBULATORY_CARE_PROVIDER_SITE_OTHER): Payer: BC Managed Care – PPO | Admitting: Neurology

## 2019-11-16 ENCOUNTER — Other Ambulatory Visit: Payer: Self-pay

## 2019-11-16 ENCOUNTER — Encounter: Payer: Self-pay | Admitting: Neurology

## 2019-11-16 ENCOUNTER — Telehealth: Payer: Self-pay | Admitting: *Deleted

## 2019-11-16 VITALS — BP 132/76 | HR 91 | Temp 96.9°F | Ht 67.0 in | Wt 302.5 lb

## 2019-11-16 DIAGNOSIS — G4700 Insomnia, unspecified: Secondary | ICD-10-CM

## 2019-11-16 DIAGNOSIS — Z79899 Other long term (current) drug therapy: Secondary | ICD-10-CM

## 2019-11-16 DIAGNOSIS — E559 Vitamin D deficiency, unspecified: Secondary | ICD-10-CM

## 2019-11-16 DIAGNOSIS — R269 Unspecified abnormalities of gait and mobility: Secondary | ICD-10-CM | POA: Diagnosis not present

## 2019-11-16 DIAGNOSIS — F418 Other specified anxiety disorders: Secondary | ICD-10-CM

## 2019-11-16 DIAGNOSIS — G35 Multiple sclerosis: Secondary | ICD-10-CM | POA: Diagnosis not present

## 2019-11-16 NOTE — Telephone Encounter (Signed)
Placed [facility] lab in [facility] lock box for routine lab pick up. Results pending. 

## 2019-11-16 NOTE — Progress Notes (Signed)
[facility]  PATIENT: Linda Sanchez DOB: [birth year]  REFERRING DOCTOR OR PCP: Daylene Posey, FNP; has been seeing Dr. Janeth Rase [facility] neurology) SOURCE: Patient, notes from Dr. Jalene Mullet, [place] reports, MRI images from 2008 and 2020 personally reviewed  _________________________________   HISTORICAL  CHIEF COMPLAINT:  Chief Complaint  Patient presents with  . New Patient (Initial Visit)    RM 13, alone. Paper referral from Waynette Buttery, MD for MS at [facility]. She has family hx of cancer and hx herself.   . Multiple Sclerosis    On Ocrevus. wanted to receives infusions closer to home. She is due next in July 2021. Was receiving every 6 months but Dr. Domingo Cocking wanted her to change to every 5 months. Feels vision more blurry, stuttering more, balance off. Ambulates with cane. She is under more stress recently. She was previously on [place].   . Spasms    on cyclobenzaprine  . Gait Problem    Ambulates with cane    HISTORY OF PRESENT ILLNESS:  I had the pleasure seeing your patient, Linda Sanchez, at the [facility] [place] [facility] at [facility] for neurologic consultation regarding her multiple sclerosis.  She is a 59 year old woman who had tongue numbness in 2008.  She also reported visual blurriness.   She was referred to Dr. Jalene Mullet.   She had a LP as well and she reports it ws consistent with MS.     She was placed on Avonex injections.   She then switched to [place].   However, she had melanoma and stopped for a few months.   Follow-up MRI reportedly showed additional lesions and she was switched to [place] since January 2020.   Her last infusion was January 2021 and she will need her next one July 2021.   She feels wiped out for a day or two after each infusion.    She has been seeing Dr. Janeth Rase at [facility] neurology.  He is retiring.  Currently, she reports difficulty with her balance.   She reports some weakness in her  legs, right a little worse than left.    She also feels her right arm is mildly weak.   She has as had muscle spasms in her legs and sometimes has jerks as well.   She is on Flexeril tid prn.  She also takes baclofen and tizanidine occasionally.   She has numbness in her tongue, right = left tip.   She denies numbness in her limbs.   Numbness is worse with stress.     She gets sone sharp shooting pains in her limbs.   She reports urinary urgency.   She has been diagnosed with interstitial cystitis.   She reports blurry vision.   She feels her night vision is worse.  She notes a stuttering speech.  She reports a lot of fatigue.   She has had a lot of depression and recently started [place] treatments.  She feels her depression is doing better with the [place] treatment.   She feels stressed.   She takes hydroxyzine for anxiety.   She had been on clonazepam.   She states she sleeps about 3 hours a night due to sleep maintenance insomnia.    She had been on phentermine for weight loss.    She felt it helped her fatigue some but she had heart palpitations and stopped.    I personally reviewed the MRI of the brain dated 05/26/2007 [facility]) it shows multiple  T2/FLAIR hyperintense foci, predominantly in the subcortical and deep white matter of the hemispheres.  None of these enhance.  These are mostly nonspecific.  A couple small foci were in the periventricular white matter.   Also reviewed the MRI of the brain and cervical spine from 10/18/2018.   The MRI of the cervical spine showed minimal degenerative changes but no nerve root compression.  The spinal cord appeared normal.   MRI of the brain showed T2 hyperintense foci within the right pons (not seen in 2008) and in the hemispheres with some progression compared to the 2008 MRI.  A few foci were in the periventricular white matter though most were nonspecific in the subcortical and deep white matter.  REVIEW OF SYSTEMS: Constitutional: No  fevers, chills, sweats, or change in appetite Eyes: No visual changes, double vision, eye pain Ear, nose and throat: No hearing loss, ear pain, nasal congestion, sore throat Cardiovascular: No chest pain, palpitations Respiratory: No shortness of breath at rest or with exertion.   No wheezes GastrointestinaI: No nausea, vomiting, diarrhea, abdominal pain, fecal incontinence Genitourinary: No dysuria, urinary retention or frequency.  No nocturia. Musculoskeletal: No neck pain, back pain Integumentary: No rash, pruritus, skin lesions Neurological: as above Psychiatric: No depression at this time.  No anxiety Endocrine: No palpitations, diaphoresis, change in appetite, change in weigh or increased thirst Hematologic/Lymphatic: No anemia, purpura, petechiae. Allergic/Immunologic: No itchy/runny eyes, nasal congestion, recent allergic reactions, rashes  ALLERGIES: Allergies  Allergen Reactions  . Amoxicillin-Pot Clavulanate Nausea Only  . Biaxin [Clarithromycin] Photosensitivity  . Buprenorphine Hcl Nausea And Vomiting  . Ciprofloxacin   . Ibuprofen Other (See Comments)    Gi problems  . Macrobid [Nitrofurantoin Macrocrystal] Nausea Only  . Morphine And Related Nausea And Vomiting  . Nsaids   . Sulfa Antibiotics   . Testim [Testosterone]   . Bactrim [Sulfamethoxazole-Trimethoprim] Rash  . Floxin [Ofloxacin] Rash    HOME MEDICATIONS:  Current Outpatient Medications:  .  acetaminophen (TYLENOL) 500 MG tablet, Take 1,000 mg by mouth every 6 (six) hours as needed., Disp: , Rfl:  .  amoxicillin (AMOXIL) 500 MG capsule, Take 500 mg by mouth once. Take 4 capsules orally 1 hour prior to appt, Disp: , Rfl:  .  atorvastatin (LIPITOR) 20 MG tablet, Take 20 mg by mouth daily., Disp: , Rfl:  .  clobetasol cream (TEMOVATE) [id number] %, Apply 1 application topically 2 (two) times daily., Disp: , Rfl:  .  cyclobenzaprine (FLEXERIL) 5 MG tablet, Take 5 mg by mouth 3 (three) times daily as needed for  muscle spasms., Disp: , Rfl:  .  dextromethorphan-guaiFENesin (MUCINEX DM) 30-600 MG 12hr tablet, Take 1 tablet by mouth daily as needed for cough., Disp: , Rfl:  .  diclofenac sodium (VOLTAREN) 1 % GEL, Apply 2 g topically at bedtime., Disp: , Rfl:  .  diphenhydrAMINE (BENADRYL) 25 MG tablet, Take 25 mg by mouth at bedtime., Disp: , Rfl:  .  ergocalciferol (VITAMIN D2) 50000 units capsule, Take 50,000 Units by mouth once a week., Disp: , Rfl:  .  esomeprazole (NEXIUM) 40 MG capsule, Take 40 mg by mouth 2 (two) times daily before a meal. , Disp: , Rfl:  .  fluticasone (FLONASE) 50 MCG/ACT nasal spray, Place 2 sprays into both nostrils daily as needed for allergies. , Disp: , Rfl:  .  hydrochlorothiazide (HYDRODIURIL) 25 MG tablet, Take 25 mg by mouth daily., Disp: , Rfl:  .  hydrOXYzine (ATARAX/VISTARIL) 50 MG tablet, Take 150 mg  by mouth daily., Disp: , Rfl:  .  KETOCONAZOLE, TOPICAL, 1 % SHAM, Apply 1 Dose topically. 2-3 times per week, Disp: , Rfl:  .  Magnesium Hydroxide (MAGNESIA PO), Take 1 tablet by mouth daily., Disp: , Rfl:  .  Methenamine-Sodium Salicylate (CYSTEX PO), Take 4 tablets by mouth daily., Disp: , Rfl:  .  Misc Natural Product Nasal (PONARIS NA), Place 2 sprays into the nose at bedtime., Disp: , Rfl:  .  Multiple Vitamins-Minerals (MULTIVITAMIN WITH MINERALS) tablet, Take 1 tablet by mouth daily., Disp: , Rfl:  .  ocrelizumab (OCREVUS) 300 MG/10ML injection, Inject into the vein once., Disp: , Rfl:  .  pentosan polysulfate (ELMIRON) 100 MG capsule, Take 100 mg by mouth 3 (three) times daily., Disp: , Rfl:  .  promethazine (PHENERGAN) 25 MG tablet, Take 25 mg by mouth every 6 (six) hours as needed for nausea or vomiting., Disp: , Rfl:  .  sucralfate (CARAFATE) 1 g tablet, Take 1 g by mouth as needed., Disp: , Rfl:  .  tiZANidine (ZANAFLEX) 2 MG tablet, Take 2 mg by mouth every 6 (six) hours as needed. , Disp: , Rfl:  .  Trospium Chloride 60 MG CP24, Take 1 capsule by mouth  daily. , Disp: , Rfl:  .  TURMERIC PO, Take 1 Dose by mouth daily., Disp: , Rfl:  .  venlafaxine XR (EFFEXOR-XR) 150 MG 24 hr capsule, Take 150 mg by mouth daily with breakfast., Disp: , Rfl:  .  venlafaxine XR (EFFEXOR-XR) 75 MG 24 hr capsule, Take 75 mg by mouth daily with breakfast., Disp: , Rfl:  .  vitamin B-12 (CYANOCOBALAMIN) 1000 MCG tablet, Take 1,000 mcg by mouth daily., Disp: , Rfl:   PAST MEDICAL HISTORY: Past Medical History:  Diagnosis Date  . Anemia   . Anxiety   . Chronic pain syndrome   . Depression   . GERD (gastroesophageal reflux disease)   . Heart murmur   . Hypertension   . Interstitial cystitis   . Menopausal symptoms   . Mitral valve prolapse   . Multiple sclerosis ([place])    right side weakness/ uses cane occassionally  . MVP (mitral valve prolapse)    Pt states she was told this in the past but an Echo from [facility] in 2015 said pt did not have MVP  . Neuromuscular disorder ([place])   . Obesity (BMI 30-39.9)   . Palpitations   . PONV (postoperative nausea and vomiting)   . Sleep apnea    lost weight and does not use cpap anymore  . Vitamin D deficiency     PAST SURGICAL HISTORY: Past Surgical History:  Procedure Laterality Date  . ABDOMINAL HYSTERECTOMY    . APPENDECTOMY    . BLADDER SUSPENSION    . BREAST SURGERY    . CHOLECYSTECTOMY    . COLONOSCOPY WITH PROPOFOL N/A 10/21/2016   Procedure: COLONOSCOPY WITH PROPOFOL;  Surgeon: Manya Silvas, MD;  Location: [facility] ENDOSCOPY;  Service: Endoscopy;  Laterality: N/A;  . COLONOSCOPY WITH PROPOFOL N/A 02/06/2019   Procedure: COLONOSCOPY WITH PROPOFOL;  Surgeon: Manya Silvas, MD;  Location: [facility] ENDOSCOPY;  Service: Endoscopy;  Laterality: N/A;  . CYSTO WITH HYDRODISTENSION N/A 01/01/2015   Procedure: CYSTOSCOPY/HYDRODISTENSION;  Surgeon: Royston Cowper, MD;  Location: [facility] ORS;  Service: Urology;  Laterality: N/A;  . ESOPHAGOGASTRODUODENOSCOPY (EGD) WITH PROPOFOL N/A 10/21/2016   Procedure:  ESOPHAGOGASTRODUODENOSCOPY (EGD) WITH PROPOFOL;  Surgeon: Manya Silvas, MD;  Location[facility] ENDOSCOPY;  Service: Endoscopy;  Laterality: N/A;  . ESOPHAGOGASTRODUODENOSCOPY (EGD) WITH PROPOFOL N/A 02/06/2019   Procedure: ESOPHAGOGASTRODUODENOSCOPY (EGD) WITH PROPOFOL;  Surgeon: Manya Silvas, MD;  Location: [facility] ENDOSCOPY;  Service: Endoscopy;  Laterality: N/A;  . JOINT REPLACEMENT Right   . TUBAL LIGATION      FAMILY HISTORY: History reviewed. No pertinent family history.  SOCIAL HISTORY:  Social History   Socioeconomic History  . Marital status: Married    Spouse name: Not on file  . Number of children: 2  . Years of education: BS  . Highest education level: Not on file  Occupational History  . Occupation: Retired   Tobacco Use  . Smoking status: Never Smoker  . Smokeless tobacco: Never Used  Substance and Sexual Activity  . Alcohol use: No  . Drug use: No  . Sexual activity: Not on file  Other Topics Concern  . Not on file  Social History Narrative   Lives with husband   Caffeine use: some   Right handed    Social Determinants of Health   Financial Resource Strain:   . Difficulty of Paying Living Expenses:   Food Insecurity:   . Worried About Charity fundraiser in the Last Year:   . Arboriculturist in the Last Year:   Transportation Needs:   . Film/video editor (Medical):   Marland Kitchen Lack of Transportation (Non-Medical):   Physical Activity:   . Days of Exercise per Week:   . Minutes of Exercise per Session:   Stress:   . Feeling of Stress :   Social Connections:   . Frequency of Communication with Friends and Family:   . Frequency of Social Gatherings with Friends and Family:   . Attends Religious Services:   . Active Member of Clubs or Organizations:   . Attends Archivist Meetings:   Marland Kitchen Marital Status:   Intimate Partner Violence:   . Fear of Current or Ex-Partner:   . Emotionally Abused:   Marland Kitchen Physically Abused:   . Sexually Abused:       PHYSICAL EXAM  Vitals:   11/16/19 0842  BP: 132/76  Pulse: 91  Temp: (!) 96.9 F (36.1 C)  Weight: (!) 302 lb 8 oz (137.2 kg)  Height: 5\' 7"  (1.702 m)    Body mass index is 47.38 kg/m.   General: The patient is well-developed and well-nourished and in no acute distress  HEENT:  Head is [place]/AT.  Sclera are anicteric.  Funduscopic exam shows normal optic discs and retinal vessels.  Neck: No carotid bruits are noted.  The neck is nontender.  Cardiovascular: The heart has a regular rate and rhythm with a normal S1 and S2. There were no murmurs, gallops or rubs.    Skin: Extremities are without rash or  edema.  Musculoskeletal:  Back is nontender  Neurologic Exam  Mental status: The patient is alert and oriented x 3 at the time of the examination. The patient has apparent normal recent and remote memory, with an apparently normal attention span and concentration ability.   Speech is normal.  Cranial nerves: Extraocular movements are full. Pupils are equal, round, and reactive to light and accomodation.  Color vision is symmetric.  Facial symmetry is present. There is good facial sensation to soft touch bilaterally.Facial strength is normal.  Trapezius and sternocleidomastoid strength is normal. No dysarthria is noted.  The tongue is midline, and the patient has symmetric elevation of the soft palate. No obvious hearing deficits are noted.  Motor:  Muscle bulk is normal.   Tone is normal. Strength is  5 / 5 in all 4 extremities.   Sensory: Sensory testing showed slightly reduced temperature sensation in the right hand relative to the left but symmetric touch.  Coordination: Cerebellar testing reveals good finger-nose-finger and heel-to-shin bilaterally.  Gait and station: Station is normal.   Gait is arthritic and mildly wide. Tandem gait is poor. Romberg is negative.   Reflexes: Deep tendon reflexes are symmetric and normal bilaterally.   Plantar responses are  flexor.    DIAGNOSTIC DATA (LABS, IMAGING, TESTING) - I reviewed patient records, labs, notes, testing and imaging myself where available.  Lab Results  Component Value Date   WBC 5.3 01/18/2012   HGB 10.6 (L) 02/03/2012   HCT 36.6 01/18/2012   MCV 92 01/18/2012   PLT 207 02/03/2012      Component Value Date/Time   NA 140 02/04/2012 0258   K 3.3 (L) 02/04/2012 0258   CL 101 02/04/2012 0258   CO2 31 02/04/2012 0258   GLUCOSE 100 (H) 02/04/2012 0258   BUN 8 02/04/2012 0258   CREATININE 0.64 02/04/2012 0258   CALCIUM 8.5 02/04/2012 0258   GFRNONAA >60 02/04/2012 0258   GFRAA >60 02/04/2012 0258       ASSESSMENT AND PLAN  Multiple sclerosis (HCC) - Plan: IgG, IgA, IgM, CBC with Differential/Platelet, Comprehensive metabolic panel, Stratify JCV Antibody Test ([facility]), MR BRAIN W WO CONTRAST  High risk medication use - Plan: IgG, IgA, IgM, CBC with Differential/Platelet, Comprehensive metabolic panel, Stratify JCV Antibody Test ([facility])  Vitamin D deficiency - Plan: VITAMIN D 25 Hydroxy (Vit-D Deficiency, Fractures)  Gait disturbance  Insomnia, unspecified type  Depression with anxiety  In summary, Ms. Swofford is a 59 year old woman who was diagnosed with relapsing remitting MS in 2008.  She was switched to [place] in January 2020 due to new lesions on her brain MRI.  She feels she has done well on that medication but she reports one of the antibody levels was reduced.  I will recheck the IgG/IgM/IgA and also CBC with differential, CMP and vitamin D.  If IgG or IgM are markedly reduced, we will need to consider a different disease modifying therapy as that could increase risk of infection.    We will also check an MRI of the brain to determine if there has been subclinical progression while on Ocrevus and consider a different DMT if this is occurring.  She is advised to continue with psychiatry as she feels she is getting benefit from the [place] treatments.  She should stay  active and try to get 7 hours of sleep.  She is advised to take the Flexeril and tizanidine right at bedtime to hopefully help resolve the sleep maintenance insomnia.   Libero Puthoff A. Felecia Shelling, MD, [facility] [id number], [id number] AM Certified in Neurology, Clinical Neurophysiology, Sleep Medicine and Neuroimaging  [facility] [address] [place], [place] [address] [phone]

## 2019-11-17 LAB — COMPREHENSIVE METABOLIC PANEL
ALT: 17 IU/L (ref 0–32)
AST: 13 IU/L (ref 0–40)
Albumin/Globulin Ratio: 1.9 (ref 1.2–2.2)
Albumin: 4.2 g/dL (ref 3.8–4.9)
Alkaline Phosphatase: 130 IU/L — ABNORMAL HIGH (ref 39–117)
BUN/Creatinine Ratio: 21 (ref 9–23)
BUN: 20 mg/dL (ref 6–24)
Bilirubin Total: 0.4 mg/dL (ref 0.0–1.2)
CO2: 25 mmol/L (ref 20–29)
Calcium: 9.1 mg/dL (ref 8.7–10.2)
Chloride: 103 mmol/L (ref 96–106)
Creatinine, Ser: 0.97 mg/dL (ref 0.57–1.00)
GFR calc Af Amer: 74 mL/min/{1.73_m2} (ref >59)
GFR calc non Af Amer: 64 mL/min/{1.73_m2} (ref >59)
Globulin, Total: 2.2 g/dL (ref 1.5–4.5)
Glucose: 98 mg/dL (ref 65–99)
Potassium: 4.3 mmol/L (ref 3.5–5.2)
Sodium: 141 mmol/L (ref 134–144)
Total Protein: 6.4 g/dL (ref 6.0–8.5)

## 2019-11-17 LAB — CBC WITH DIFFERENTIAL/PLATELET
Basophils Absolute: 0.1 10*3/uL (ref 0.0–0.2)
Basos: 1 %
EOS (ABSOLUTE): 0.2 10*3/uL (ref 0.0–0.4)
Eos: 2 %
Hematocrit: 39.2 % (ref 34.0–46.6)
Hemoglobin: 13.1 g/dL (ref 11.1–15.9)
Immature Grans (Abs): 0.1 10*3/uL (ref 0.0–0.1)
Immature Granulocytes: 1 %
Lymphocytes Absolute: 1.9 10*3/uL (ref 0.7–3.1)
Lymphs: 18 %
MCH: 30.6 pg (ref 26.6–33.0)
MCHC: 33.4 g/dL (ref 31.5–35.7)
MCV: 92 fL (ref 79–97)
Monocytes Absolute: 0.8 10*3/uL (ref 0.1–0.9)
Monocytes: 8 %
Neutrophils Absolute: 7.6 10*3/uL — ABNORMAL HIGH (ref 1.4–7.0)
Neutrophils: 70 %
Platelets: 363 10*3/uL (ref 150–450)
RBC: 4.28 x10E6/uL (ref 3.77–5.28)
RDW: 13.6 % (ref 11.7–15.4)
WBC: 10.7 10*3/uL (ref 3.4–10.8)

## 2019-11-17 LAB — IGG, IGA, IGM
IgA/Immunoglobulin A, Serum: 150 mg/dL (ref 87–352)
IgG (Immunoglobin G), Serum: 610 mg/dL (ref 586–1602)
IgM (Immunoglobulin M), Srm: 30 mg/dL (ref 26–217)

## 2019-11-17 LAB — VITAMIN D 25 HYDROXY (VIT D DEFICIENCY, FRACTURES): Vit D, 25-Hydroxy: 35.7 ng/mL (ref 30.0–100.0)

## 2019-11-21 ENCOUNTER — Telehealth: Payer: Self-pay | Admitting: Neurology

## 2019-11-21 NOTE — Telephone Encounter (Signed)
Medicare/bcbs [place] auth: NPR Ref # [id number] order sent to [facility]. They will reach out to the patient to schedule.

## 2019-11-22 ENCOUNTER — Telehealth: Payer: Self-pay | Admitting: *Deleted

## 2019-11-22 NOTE — Telephone Encounter (Signed)
Faxed completed/signed Ocrevus start form to [facility] at [phone]. Received fax confirmation.

## 2019-11-22 NOTE — Telephone Encounter (Signed)
Gave completed/signed start form/orders/office notes to [facility] to start processing for pt.

## 2019-11-23 NOTE — Telephone Encounter (Signed)
JCV ab drawn on 11/16/19 positive, index: 3.94

## 2019-12-20 ENCOUNTER — Other Ambulatory Visit: Payer: Self-pay

## 2019-12-20 ENCOUNTER — Ambulatory Visit
Admission: RE | Admit: 2019-12-20 | Discharge: 2019-12-20 | Disposition: A | Discharge status: Home / Self Care | Payer: BC Managed Care – PPO | Source: Ambulatory Visit | Attending: Neurology | Admitting: Neurology

## 2019-12-20 ENCOUNTER — Telehealth: Payer: Self-pay | Admitting: Neurology

## 2019-12-20 DIAGNOSIS — G35 Multiple sclerosis: Secondary | ICD-10-CM | POA: Diagnosis not present

## 2019-12-20 MED ORDER — GADOBENATE DIMEGLUMINE 529 MG/ML IV SOLN
20.0000 mL | Freq: Once | INTRAVENOUS | Status: AC | PRN
  Administered 2019-12-20: 20 mL via INTRAVENOUS

## 2019-12-20 NOTE — Telephone Encounter (Signed)
Please let her know that I reviewed the MRI of the brain and compared it to the one from 2020 in [place].  There are no new lesions.  We can go ahead and send in Voltaren gel.

## 2019-12-20 NOTE — Telephone Encounter (Signed)
Pt called needing to speak to RN about her diclofenac sodium (VOLTAREN) 1 % GEL and information that her pharmacist gave her. Please advise.

## 2019-12-20 NOTE — Telephone Encounter (Signed)
Called pt back. States previous neurologist prescribed voltaren gel 1% prn for MS aches. She would like Dr. Felecia Shelling to prescribe this moving forward. 3 tubes[facility]. She can get 3 tubes for 20.00.  Advised I will send request to Dr. Felecia Shelling.   She also had MRI this morning and anxious to here about her results. Advised we will call as soon as they are available.

## 2019-12-21 ENCOUNTER — Other Ambulatory Visit: Payer: Self-pay | Admitting: *Deleted

## 2019-12-21 ENCOUNTER — Other Ambulatory Visit: Payer: Self-pay | Admitting: Neurology

## 2019-12-21 MED ORDER — TIZANIDINE HCL 2 MG PO TABS: 2.0000 mg | ORAL_TABLET | Freq: Four times a day (QID) | ORAL | 2 refills | Status: DC | PRN

## 2019-12-21 MED ORDER — DICLOFENAC SODIUM 1 % EX GEL: 2.0000 g | CUTANEOUS | 3 refills | Status: DC | PRN

## 2019-12-21 NOTE — Telephone Encounter (Signed)
Submitted PA diclofenac sodium 1% on CMM. KeyHA:[id number]. [facility].

## 2019-12-21 NOTE — Telephone Encounter (Signed)
Sent pt mychart message back 

## 2019-12-25 NOTE — Telephone Encounter (Signed)
CMM had more questions to be answered. Submitted and request approved. [id number];Review Type:Prior Auth;Coverage Start Date:11/25/2019;Coverage End Date:12/24/2020;

## 2020-02-06 ENCOUNTER — Telehealth: Payer: Self-pay | Admitting: Neurology

## 2020-02-06 NOTE — Telephone Encounter (Signed)
It would be best to do the shingles vaccination during the month or 2 before her next Ocrevus infusion.  She does need to know that vaccinations are sometimes less effective with the medication like Ocrevus but that there still could be some effectiveness.

## 2020-02-06 NOTE — Telephone Encounter (Signed)
Pt called wanting to know if she should get her Shingles shot before or after her infusion. Please advise.

## 2020-02-07 NOTE — Telephone Encounter (Signed)
I reached out to the pt and advised of Dr. Rexene Alberts recommendation. She verbalized understanding and had no further questions at this time.

## 2020-02-18 ENCOUNTER — Other Ambulatory Visit: Payer: Self-pay | Admitting: Neurology

## 2020-04-25 ENCOUNTER — Ambulatory Visit: Payer: Medicare Other | Admitting: Neurology

## 2020-05-21 ENCOUNTER — Other Ambulatory Visit: Payer: Self-pay | Admitting: Physician Assistant

## 2020-05-21 DIAGNOSIS — H9201 Otalgia, right ear: Secondary | ICD-10-CM

## 2020-05-22 ENCOUNTER — Ambulatory Visit: Payer: Medicare Other | Admitting: Neurology

## 2020-06-03 ENCOUNTER — Other Ambulatory Visit: Payer: Self-pay

## 2020-06-03 ENCOUNTER — Ambulatory Visit
Admission: RE | Admit: 2020-06-03 | Discharge: 2020-06-03 | Disposition: A | Discharge status: Home / Self Care | Payer: BC Managed Care – PPO | Source: Ambulatory Visit | Attending: Physician Assistant | Admitting: Physician Assistant

## 2020-06-03 DIAGNOSIS — H9201 Otalgia, right ear: Secondary | ICD-10-CM | POA: Insufficient documentation

## 2020-06-03 HISTORY — DX: Malignant (primary) neoplasm, unspecified: C80.1

## 2020-06-03 LAB — POCT I-STAT CREATININE: Creatinine, Ser: 1 mg/dL (ref 0.44–1.00)

## 2020-06-03 MED ORDER — IOHEXOL 300 MG/ML  SOLN
75.0000 mL | Freq: Once | INTRAMUSCULAR | Status: AC | PRN
  Administered 2020-06-03: 75 mL via INTRAVENOUS

## 2020-07-17 ENCOUNTER — Other Ambulatory Visit: Payer: Self-pay | Admitting: Neurology

## 2020-07-26 ENCOUNTER — Telehealth: Payer: Self-pay | Admitting: Neurology

## 2020-07-26 NOTE — Telephone Encounter (Signed)
Pt has called asking Dr Felecia Shelling to manage Baclofen prescription.  Pt states she is down to 3 and in need.  Please call pt to discuss.(Pt made aware office will not reopen before Monday)

## 2020-07-29 MED ORDER — BACLOFEN 10 MG PO TABS
10.0000 mg | ORAL_TABLET | Freq: Four times a day (QID) | ORAL | 0 refills | Status: DC
Start: 2020-07-29 — End: 2021-02-20

## 2020-07-29 MED ORDER — BACLOFEN 10 MG PO TABS
10.0000 mg | ORAL_TABLET | Freq: Three times a day (TID) | ORAL | 2 refills | Status: DC
Start: 2020-07-29 — End: 2021-02-20

## 2020-07-29 NOTE — Addendum Note (Signed)
Addended by: Wyvonnia Lora on: 07/29/2020 01:13 PM   Modules accepted: Orders

## 2020-07-29 NOTE — Telephone Encounter (Signed)
Called pt back. She reports she was previously prescribed baclofen 10mg  po QID. She is only taking QID since she takes tizanidine as well. Has been out our baclofen for 2 days now.   She also takes tizandine 2mg  po qhs and cyclobenzaprine 5mg  po q am and q evening (does not take at bedtime since she takes tizanidine at bed).   She was last seen 11/14/19 and next f/u 08/07/20.

## 2020-07-29 NOTE — Telephone Encounter (Signed)
We can refill the baclofen at a dose of 10 mg 4 times daily   #120 one year

## 2020-07-29 NOTE — Telephone Encounter (Addendum)
Called pt. Relayed Dr. Felecia Shelling approved for refill on baclofen 10mg  po QID.  [facility] and [facility] for future refills after that. Confirmed with pt that she has upcoming follow up with Dr. Felecia Shelling on 08/07/20 at 3pm.

## 2020-08-07 ENCOUNTER — Encounter: Payer: Self-pay | Admitting: Neurology

## 2020-08-07 ENCOUNTER — Ambulatory Visit (INDEPENDENT_AMBULATORY_CARE_PROVIDER_SITE_OTHER): Payer: BC Managed Care – PPO | Admitting: Neurology

## 2020-08-07 VITALS — BP 152/80 | HR 95 | Ht 67.0 in | Wt 309.5 lb

## 2020-08-07 DIAGNOSIS — G4700 Insomnia, unspecified: Secondary | ICD-10-CM

## 2020-08-07 DIAGNOSIS — R269 Unspecified abnormalities of gait and mobility: Secondary | ICD-10-CM | POA: Diagnosis not present

## 2020-08-07 DIAGNOSIS — G35 Multiple sclerosis: Secondary | ICD-10-CM | POA: Diagnosis not present

## 2020-08-07 DIAGNOSIS — Z79899 Other long term (current) drug therapy: Secondary | ICD-10-CM

## 2020-08-07 DIAGNOSIS — F418 Other specified anxiety disorders: Secondary | ICD-10-CM

## 2020-08-07 MED ORDER — TIZANIDINE HCL 2 MG PO TABS: 2.0000 mg | ORAL_TABLET | Freq: Four times a day (QID) | ORAL | 5 refills | Status: DC | PRN

## 2020-08-07 NOTE — Progress Notes (Signed)
[facility]  PATIENT: Linda Sanchez DOB: 1960/11/01  REFERRING DOCTOR OR PCP: Daylene Posey, FNP; has been seeing Dr. Janeth Rase [facility] neurology) SOURCE: Patient, notes from Dr. Jalene Mullet, [place] reports, MRI images from 2008 and 2020 personally reviewed  _________________________________   HISTORICAL  CHIEF COMPLAINT:  Chief Complaint  Patient presents with  . Follow-up    RM 12, alone. Last seen 11/16/2019. On Ocrevus for MS. Last infusion: 03/13/20, next: 09/25/20. Receives at [facility] with intrafusion. Ambulates with cane. Has had 2 falls since last visit. One, she leaned over and fell. Fell on her knees into pillows. Second fall, she denies hitting head or any sig injuries.     HISTORY OF PRESENT ILLNESS:  Linda Sanchez is a 59 y.o. woman with relapsing remitting multiple sclerosis.  Update 08/07/2020: She is on Ocrevus and has tolerated it well.   Her last infusion was 03/04/2020 and next is 2/2/20222.     She is noting more sharp pains in her feet.  She continues to have post-herpetic pain in the right shoulder regin form shingles she had last year.   She notes some blisters in her mouth at times.   She had benefit from [facility] magic mouthwash in the past  Gait is about the same and she uses a cane due to poor balance   She reports mild right arm and leg  weakness   She has =muscle spasms in her legs and sometimes has jerks as well.   baclofen and tizanidine helps.      She has numbness in her tongue, right = left tip.   She denies numbness in her limbs.   Numbness is worse with stress.     She gets sone sharp shooting pains in her limbs.   She reports urinary urgency and rare incontinence.  She also has IC.    She has been diagnosed with interstitial cystitis.   She reports blurry vision and is going to see ophthalmology. .    She reports a lot of fatigue.   She has had a lot of depression and recently started [place] treatments.  She feels her depression  is doing better after [place] treatment. Her brother in law committed suicide and that has affected her ,  Abilify was recently added.    Insomnia is better with current medications.     She felt it helped her fatigue some but she had heart palpitations and stopped.    She had melanoma on her nose believed cure by surgery.     MS History: She is a 59 year old woman who had tongue numbness in 2008.  She also reported visual blurriness.   She was referred to Dr. Jalene Mullet.   She had a LP as well and she reports it ws consistent with MS.     She was placed on Avonex injections.   She then switched to [place].   However, she had melanoma and stopped for a few months.   Follow-up MRI reportedly showed additional lesions and she was switched to [place] since January 2020.   Her last infusion was January 2021 and she will need her next one July 2021.   She feels wiped out for a day or two after each infusion.     MRI of the brain dated 05/26/2007 [facility]) it shows multiple T2/FLAIR hyperintense foci, predominantly in the subcortical and deep white matter of the hemispheres.  None of these enhance.  These are mostly nonspecific.  A  couple small foci were in the periventricular white matter.    MRI of the brain and cervical spine from 10/18/2018.   The MRI of the cervical spine showed minimal degenerative changes but no nerve root compression.  The spinal cord appeared normal.   MRI of the brain showed T2 hyperintense foci within the right pons (not seen in 2008) and in the hemispheres with some progression compared to the 2008 MRI.  A few foci were in the periventricular white matter though most were nonspecific in the subcortical and deep white matter.  REVIEW OF SYSTEMS: Constitutional: No fevers, chills, sweats, or change in appetite Eyes: No visual changes, double vision, eye pain Ear, nose and throat: No hearing loss, ear pain, nasal congestion, sore throat Cardiovascular: No chest  pain, palpitations Respiratory: No shortness of breath at rest or with exertion.   No wheezes GastrointestinaI: No nausea, vomiting, diarrhea, abdominal pain, fecal incontinence Genitourinary: No dysuria, urinary retention or frequency.  No nocturia. Musculoskeletal: No neck pain, back pain Integumentary: No rash, pruritus, skin lesions Neurological: as above Psychiatric: No depression at this time.  No anxiety Endocrine: No palpitations, diaphoresis, change in appetite, change in weigh or increased thirst Hematologic/Lymphatic: No anemia, purpura, petechiae. Allergic/Immunologic: No itchy/runny eyes, nasal congestion, recent allergic reactions, rashes  ALLERGIES: Allergies  Allergen Reactions  . Amoxicillin-Pot Clavulanate Nausea Only  . Biaxin [Clarithromycin] Photosensitivity  . Buprenorphine Hcl Nausea And Vomiting  . Ciprofloxacin   . Ibuprofen Other (See Comments)    Gi problems  . Macrobid [Nitrofurantoin Macrocrystal] Nausea Only  . Morphine And Related Nausea And Vomiting  . Nsaids   . Sulfa Antibiotics   . Testim [Testosterone]   . Bactrim [Sulfamethoxazole-Trimethoprim] Rash  . Floxin [Ofloxacin] Rash    HOME MEDICATIONS:  Current Outpatient Medications:  .  acetaminophen (TYLENOL) 500 MG tablet, Take 1,000 mg by mouth every 6 (six) hours as needed., Disp: , Rfl:  .  amoxicillin (AMOXIL) 500 MG capsule, Take 500 mg by mouth once. Take 4 capsules orally 1 hour prior to appt, Disp: , Rfl:  .  ARIPiprazole (ABILIFY) 5 MG tablet, Take 5 mg by mouth daily., Disp: , Rfl:  .  atorvastatin (LIPITOR) 20 MG tablet, Take 20 mg by mouth daily., Disp: , Rfl:  .  baclofen (LIORESAL) 10 MG tablet, Take 1 tablet (10 mg total) by mouth 4 (four) times daily., Disp: 360 each, Rfl: 0 .  baclofen (LIORESAL) 10 MG tablet, Take 1 tablet (10 mg total) by mouth 3 (three) times daily., Disp: 360 each, Rfl: 2 .  clobetasol cream (TEMOVATE) 0.27 %, Apply 1 application topically 2 (two)  times daily., Disp: , Rfl:  .  cyclobenzaprine (FLEXERIL) 5 MG tablet, Take 5 mg by mouth 3 (three) times daily as needed for muscle spasms., Disp: , Rfl:  .  diclofenac Sodium (VOLTAREN) 1 % GEL, Apply 2 g topically as needed., Disp: 300 g, Rfl: 3 .  diphenhydrAMINE (BENADRYL) 25 MG tablet, Take 25 mg by mouth at bedtime., Disp: , Rfl:  .  ergocalciferol (VITAMIN D2) 50000 units capsule, Take 50,000 Units by mouth once a week., Disp: , Rfl:  .  esomeprazole (NEXIUM) 40 MG capsule, Take 40 mg by mouth 2 (two) times daily before a meal. , Disp: , Rfl:  .  fluticasone (FLONASE) 50 MCG/ACT nasal spray, Place 2 sprays into both nostrils daily as needed for allergies. , Disp: , Rfl:  .  hydrochlorothiazide (HYDRODIURIL) 25 MG tablet, Take 25 mg by mouth  daily., Disp: , Rfl:  .  hydrOXYzine (ATARAX/VISTARIL) 50 MG tablet, Take 150 mg by mouth daily., Disp: , Rfl:  .  KETOCONAZOLE, TOPICAL, 1 % SHAM, Apply 1 Dose topically. 2-3 times per week, Disp: , Rfl:  .  Magnesium Hydroxide (MAGNESIA PO), Take 1 tablet by mouth daily., Disp: , Rfl:  .  Methenamine-Sodium Salicylate (CYSTEX PO), Take 4 tablets by mouth daily., Disp: , Rfl:  .  Multiple Vitamins-Minerals (MULTIVITAMIN WITH MINERALS) tablet, Take 1 tablet by mouth daily., Disp: , Rfl:  .  ocrelizumab (OCREVUS) 300 MG/10ML injection, Inject into the vein once., Disp: , Rfl:  .  pentosan polysulfate (ELMIRON) 100 MG capsule, Take 100 mg by mouth 3 (three) times daily., Disp: , Rfl:  .  promethazine (PHENERGAN) 25 MG tablet, Take 25 mg by mouth every 6 (six) hours as needed for nausea or vomiting., Disp: , Rfl:  .  sucralfate (CARAFATE) 1 g tablet, Take 1 g by mouth as needed., Disp: , Rfl:  .  tiZANidine (ZANAFLEX) 2 MG tablet, TAKE 1 TABLET (2 MG TOTAL) BY MOUTH EVERY 6 (SIX) HOURS AS NEEDED., Disp: 90 tablet, Rfl: 2 .  Trospium Chloride 60 MG CP24, Take 1 capsule by mouth daily. , Disp: , Rfl:  .  TURMERIC PO, Take 1 Dose by mouth daily., Disp: , Rfl:   .  venlafaxine XR (EFFEXOR-XR) 150 MG 24 hr capsule, Take 150 mg by mouth daily with breakfast., Disp: , Rfl:  .  vitamin B-12 (CYANOCOBALAMIN) 1000 MCG tablet, Take 1,000 mcg by mouth daily., Disp: , Rfl:   PAST MEDICAL HISTORY: Past Medical History:  Diagnosis Date  . Anemia   . Anxiety   . Cancer ([place])   . Chronic pain syndrome   . Depression   . GERD (gastroesophageal reflux disease)   . Heart murmur   . Hypertension   . Interstitial cystitis   . Menopausal symptoms   . Mitral valve prolapse   . Multiple sclerosis ([place])    right side weakness/ uses cane occassionally  . MVP (mitral valve prolapse)    Pt states she was told this in the past but an Echo from [facility] in 2015 said pt did not have MVP  . Neuromuscular disorder ([place])   . Obesity (BMI 30-39.9)   . Palpitations   . PONV (postoperative nausea and vomiting)   . Sleep apnea    lost weight and does not use cpap anymore  . Vitamin D deficiency     PAST SURGICAL HISTORY: Past Surgical History:  Procedure Laterality Date  . ABDOMINAL HYSTERECTOMY    . APPENDECTOMY    . BLADDER SUSPENSION    . BREAST SURGERY    . CHOLECYSTECTOMY    . COLONOSCOPY WITH PROPOFOL N/A 10/21/2016   Procedure: COLONOSCOPY WITH PROPOFOL;  Surgeon: Manya Silvas, MD;  Location: [facility] ENDOSCOPY;  Service: Endoscopy;  Laterality: N/A;  . COLONOSCOPY WITH PROPOFOL N/A 02/06/2019   Procedure: COLONOSCOPY WITH PROPOFOL;  Surgeon: Manya Silvas, MD;  Location: [facility] ENDOSCOPY;  Service: Endoscopy;  Laterality: N/A;  . CYSTO WITH HYDRODISTENSION N/A 01/01/2015   Procedure: CYSTOSCOPY/HYDRODISTENSION;  Surgeon: Royston Cowper, MD;  Location: [facility] ORS;  Service: Urology;  Laterality: N/A;  . ESOPHAGOGASTRODUODENOSCOPY (EGD) WITH PROPOFOL N/A 10/21/2016   Procedure: ESOPHAGOGASTRODUODENOSCOPY (EGD) WITH PROPOFOL;  Surgeon: Manya Silvas, MD;  Location: [facility] ENDOSCOPY;  Service: Endoscopy;  Laterality: N/A;  . ESOPHAGOGASTRODUODENOSCOPY (EGD) WITH  PROPOFOL N/A 02/06/2019   Procedure: ESOPHAGOGASTRODUODENOSCOPY (EGD) WITH PROPOFOL;  Surgeon: Manya Silvas, MD;  Location: [facility] ENDOSCOPY;  Service: Endoscopy;  Laterality: N/A;  . JOINT REPLACEMENT Right   . TUBAL LIGATION      FAMILY HISTORY: No family history on file.  SOCIAL HISTORY:  Social History   Socioeconomic History  . Marital status: Married    Spouse name: Not on file  . Number of children: 2  . Years of education: BS  . Highest education level: Not on file  Occupational History  . Occupation: Retired   Tobacco Use  . Smoking status: Never Smoker  . Smokeless tobacco: Never Used  Vaping Use  . Vaping Use: Never used  Substance and Sexual Activity  . Alcohol use: No  . Drug use: No  . Sexual activity: Not on file  Other Topics Concern  . Not on file  Social History Narrative   Lives with husband   Caffeine use: some   Right handed    Social Determinants of Health   Financial Resource Strain: Not on file  Food Insecurity: Not on file  Transportation Needs: Not on file  Physical Activity: Not on file  Stress: Not on file  Social Connections: Not on file  Intimate Partner Violence: Not on file     PHYSICAL EXAM  Vitals:   08/07/20 1428  BP: (!) 152/80  Pulse: 95  SpO2: 97%  Weight: (!) 309 lb 8 oz (140.4 kg)  Height: 5\' 7"  (1.702 m)    Body mass index is 48.47 kg/m.   General: The patient is well-developed and well-nourished and in no acute distress  HEENT:  Head is [place]/AT.  Sclera are anicteric.    Skin: Extremities are without rash or  edema.  Neurologic Exam  Mental status: The patient is alert and oriented x 3 at the time of the examination. The patient has apparent normal recent and remote memory, with an apparently normal attention span and concentration ability.   Speech is normal.  Cranial nerves: Extraocular movements are full. Facial strength is normal. . No obvious hearing deficits are noted.  Motor:  Muscle bulk is  normal.   Tone is normal. Strength is  5 / 5 in all 4 extremities except 4/5 EHL, ankle ext on right  Sensory: Sensory testing is fairly symmetric to touch x 4.   Coordination: Cerebellar testing reveals good finger-nose-finger and heel-to-shin bilaterally.  Gait and station: Station is normal.   Gait is arthritic and mildly wide. Tandem gait is poor. Romberg is negative.   Reflexes: Deep tendon reflexes are symmetric and normal bilaterally.     DIAGNOSTIC DATA (LABS, IMAGING, TESTING) - I reviewed patient records, labs, notes, testing and imaging myself where available.  Lab Results  Component Value Date   WBC 10.7 11/16/2019   HGB 13.1 11/16/2019   HCT 39.2 11/16/2019   MCV 92 11/16/2019   PLT 363 11/16/2019      Component Value Date/Time   NA 141 11/16/2019 0948   NA 140 02/04/2012 0258   K 4.3 11/16/2019 0948   K 3.3 (L) 02/04/2012 0258   CL 103 11/16/2019 0948   CL 101 02/04/2012 0258   CO2 25 11/16/2019 0948   CO2 31 02/04/2012 0258   GLUCOSE 98 11/16/2019 0948   GLUCOSE 100 (H) 02/04/2012 0258   BUN 20 11/16/2019 0948   BUN 8 02/04/2012 0258   CREATININE 1.00 06/03/2020 0808   CREATININE 0.64 02/04/2012 0258   CALCIUM 9.1 11/16/2019 0948   CALCIUM 8.5 02/04/2012 0258  PROT 6.4 11/16/2019 0948   ALBUMIN 4.2 11/16/2019 0948   AST 13 11/16/2019 0948   ALT 17 11/16/2019 0948   ALKPHOS 130 (H) 11/16/2019 0948   BILITOT 0.4 11/16/2019 0948   GFRNONAA 64 11/16/2019 0948   GFRNONAA >60 02/04/2012 0258   GFRAA 74 11/16/2019 0948   GFRAA >60 02/04/2012 0258       ASSESSMENT AND PLAN  Multiple sclerosis (HCC)  High risk medication use  Gait disturbance  Insomnia, unspecified type  Depression with anxiety   1.  Continue Ocrevus,  Check labs 2.  Continue baclofen, tizanidine.   3.  rtc 6 months or sooner if new or worsening symptoms  Herman Fiero A. Felecia Shelling, MD, [facility] 39/10/90, 3:30 PM Certified in Neurology, Clinical Neurophysiology, Sleep Medicine  and Neuroimaging  [facility] [address] [place], [place] [address] [phone]

## 2020-08-08 LAB — CBC WITH DIFFERENTIAL/PLATELET
Basophils Absolute: 0.1 10*3/uL (ref 0.0–0.2)
Basos: 1 %
EOS (ABSOLUTE): 0.3 10*3/uL (ref 0.0–0.4)
Eos: 3 %
Hematocrit: 38.4 % (ref 34.0–46.6)
Hemoglobin: 12.9 g/dL (ref 11.1–15.9)
Immature Grans (Abs): 0.1 10*3/uL (ref 0.0–0.1)
Immature Granulocytes: 1 %
Lymphocytes Absolute: 2.2 10*3/uL (ref 0.7–3.1)
Lymphs: 22 %
MCH: 30.7 pg (ref 26.6–33.0)
MCHC: 33.6 g/dL (ref 31.5–35.7)
MCV: 91 fL (ref 79–97)
Monocytes Absolute: 0.8 10*3/uL (ref 0.1–0.9)
Monocytes: 8 %
Neutrophils Absolute: 6.7 10*3/uL (ref 1.4–7.0)
Neutrophils: 65 %
Platelets: 347 10*3/uL (ref 150–450)
RBC: 4.2 x10E6/uL (ref 3.77–5.28)
RDW: 13 % (ref 11.7–15.4)
WBC: 10.2 10*3/uL (ref 3.4–10.8)

## 2020-08-08 LAB — IGG, IGA, IGM
IgA/Immunoglobulin A, Serum: 154 mg/dL (ref 87–352)
IgG (Immunoglobin G), Serum: 580 mg/dL — ABNORMAL LOW (ref 586–1602)
IgM (Immunoglobulin M), Srm: 31 mg/dL (ref 26–217)

## 2020-08-13 ENCOUNTER — Telehealth: Payer: Self-pay | Admitting: Neurology

## 2020-08-13 NOTE — Telephone Encounter (Signed)
I had to write this out again... I'll place in the pod

## 2020-08-13 NOTE — Telephone Encounter (Signed)
Pt. states she has misplaced prescription for [facility] magic mouthwash & is asking for another one. Please advise.

## 2020-08-13 NOTE — Telephone Encounter (Signed)
Dr. Felecia Shelling- I do not see where you have prescribed this? Did you want to call this in for her?

## 2020-08-14 NOTE — Telephone Encounter (Signed)
Pt called, Not feeling well not able to pick up medication at [place]. Can send to [facility] to [place], [place]. Would like a call from the nurse to confirm.

## 2020-08-14 NOTE — Telephone Encounter (Signed)
I faxed to [facility].  They received will fill.  I informed pt.  She appreciated this.

## 2020-08-14 NOTE — Telephone Encounter (Signed)
Called pt. Advised Dr. Felecia Shelling wrote another rx for her. She will pick up before 5pm today. I placed up front for pick up.

## 2020-10-23 ENCOUNTER — Encounter: Payer: Self-pay | Admitting: Neurology

## 2020-10-23 ENCOUNTER — Ambulatory Visit (INDEPENDENT_AMBULATORY_CARE_PROVIDER_SITE_OTHER): Payer: BC Managed Care – PPO | Admitting: Neurology

## 2020-10-23 VITALS — BP 159/90 | HR 97 | Ht 67.0 in | Wt 308.0 lb

## 2020-10-23 DIAGNOSIS — F332 Major depressive disorder, recurrent severe without psychotic features: Secondary | ICD-10-CM | POA: Diagnosis not present

## 2020-10-23 DIAGNOSIS — R269 Unspecified abnormalities of gait and mobility: Secondary | ICD-10-CM | POA: Diagnosis not present

## 2020-10-23 DIAGNOSIS — G35 Multiple sclerosis: Secondary | ICD-10-CM | POA: Diagnosis not present

## 2020-10-23 DIAGNOSIS — Z79899 Other long term (current) drug therapy: Secondary | ICD-10-CM | POA: Diagnosis not present

## 2020-10-23 MED ORDER — ERGOCALCIFEROL 1.25 MG (50000 UT) PO CAPS: 50000.0000 [IU] | ORAL_CAPSULE | ORAL | 3 refills | Status: DC

## 2020-10-23 NOTE — Progress Notes (Signed)
[facility]  PATIENT: Linda Sanchez DOB: 1961-08-22  REFERRING DOCTOR OR PCP: Daylene Posey, FNP; was seeing Dr. Janeth Rase [facility] neurology) SOURCE: Patient, notes from Dr. Jalene Mullet, [place] reports, MRI images from 2008 and 2020 personally reviewed  _________________________________   HISTORICAL  CHIEF COMPLAINT:  Chief Complaint  Patient presents with  . Follow-up    RM 13,alone. Last seen 08/07/20. On Ocrevus for MS. Last infusion: 10/07/20, next: 04/21/21. Receives at [facility] w/ intrafusion. No falls since last seen. Ambulates with cane. For last 1.5 months, more depressed, having increased anxiety. PCP trying to help with this. Only wants to sleep. Decreased interest.      HISTORY OF PRESENT ILLNESS:  Linda Sanchez is a 60 y.o. woman with relapsing remitting multiple sclerosis.  Update 10/23/2020: She is on Ocrevus and has tolerated it well.   Her last infusion was 2/2/20222.     Gait is about the same and she uses a cane due to poor balance   She reports mild right arm and leg  weakness   She has =muscle spasms in her legs and sometimes has jerks as well.   Baclofen and tizanidine has helped the spasticity.      She has numbness in her tongue, right = left tip.   She denies numbness in her limbs.   Numbness is worse with stress.     Sometimes, she has sharp shooting pains in her limbs.   She reports urinary urgency and rare incontinence.   She has been diagnosed with interstitial cystitis.   She reports a lot of fatigue.   She had blisters in her mouth helped by [employer] magic mouthwash.   She has worsening depression again.    Last year, she did TMS treatments with some benefit.   Her brother in law committed suicide and that has affected her ,  Abilify helped her some initially but a higher dose has not helped much.     She is sleepy during the day and then may have insomnia at night.   She takes 150 mg hydroxyzine at bedtime.    She felt it helped her  fatigue some but she had heart palpitations and stopped.    She had melanoma on her nose believed cure by surgery.     MS History: She is a 60 year old woman who had tongue numbness in 2008.  She also reported visual blurriness.   She was referred to Dr. Jalene Mullet.   She had a LP as well and she reports it ws consistent with MS.     She was placed on Avonex injections.   She then switched to [place].   However, she had melanoma and stopped for a few months.   Follow-up MRI reportedly showed additional lesions and she was switched to [place] since January 2020.   Her last infusion was January 2021 and she will need her next one July 2021.   She feels wiped out for a day or two after each infusion.     MRI of the brain dated 05/26/2007 [facility]) it shows multiple T2/FLAIR hyperintense foci, predominantly in the subcortical and deep white matter of the hemispheres.  None of these enhance.  These are mostly nonspecific.  A couple small foci were in the periventricular white matter.    MRI of the brain and cervical spine from 10/18/2018.   The MRI of the cervical spine showed minimal degenerative changes but no nerve root compression.  The spinal cord  appeared normal.   MRI of the brain showed T2 hyperintense foci within the right pons (not seen in 2008) and in the hemispheres with some progression compared to the 2008 MRI.  A few foci were in the periventricular white matter though most were nonspecific in the subcortical and deep white matter.  REVIEW OF SYSTEMS: Constitutional: No fevers, chills, sweats, or change in appetite Eyes: No visual changes, double vision, eye pain Ear, nose and throat: No hearing loss, ear pain, nasal congestion, sore throat Cardiovascular: No chest pain, palpitations Respiratory: No shortness of breath at rest or with exertion.   No wheezes GastrointestinaI: No nausea, vomiting, diarrhea, abdominal pain, fecal incontinence Genitourinary: No  dysuria, urinary retention or frequency.  No nocturia. Musculoskeletal: No neck pain, back pain Integumentary: No rash, pruritus, skin lesions Neurological: as above Psychiatric: Has depression > anxiety Endocrine: No palpitations, diaphoresis, change in appetite, change in weigh or increased thirst Hematologic/Lymphatic: No anemia, purpura, petechiae. Allergic/Immunologic: No itchy/runny eyes, nasal congestion, recent allergic reactions, rashes  ALLERGIES: Allergies  Allergen Reactions  . Amoxicillin-Pot Clavulanate Nausea Only  . Biaxin [Clarithromycin] Photosensitivity  . Buprenorphine Hcl Nausea And Vomiting  . Ciprofloxacin   . Ibuprofen Other (See Comments)    Gi problems  . Macrobid [Nitrofurantoin Macrocrystal] Nausea Only  . Morphine And Related Nausea And Vomiting  . Nsaids   . Sulfa Antibiotics   . Testim [Testosterone]   . Bactrim [Sulfamethoxazole-Trimethoprim] Rash  . Floxin [Ofloxacin] Rash    HOME MEDICATIONS:  Current Outpatient Medications:  .  acetaminophen (TYLENOL) 500 MG tablet, Take 1,000 mg by mouth every 6 (six) hours as needed., Disp: , Rfl:  .  amoxicillin (AMOXIL) 500 MG capsule, Take 500 mg by mouth once. Take 4 capsules orally 1 hour prior to appt, Disp: , Rfl:  .  atorvastatin (LIPITOR) 20 MG tablet, Take 20 mg by mouth daily., Disp: , Rfl:  .  baclofen (LIORESAL) 10 MG tablet, Take 1 tablet (10 mg total) by mouth 4 (four) times daily., Disp: 360 each, Rfl: 0 .  baclofen (LIORESAL) 10 MG tablet, Take 1 tablet (10 mg total) by mouth 3 (three) times daily., Disp: 360 each, Rfl: 2 .  clobetasol cream (TEMOVATE) 0.93 %, Apply 1 application topically 2 (two) times daily., Disp: , Rfl:  .  cyclobenzaprine (FLEXERIL) 5 MG tablet, Take 5 mg by mouth 3 (three) times daily as needed for muscle spasms., Disp: , Rfl:  .  diclofenac Sodium (VOLTAREN) 1 % GEL, Apply 2 g topically as needed., Disp: 300 g, Rfl: 3 .  diphenhydrAMINE (BENADRYL) 25 MG tablet, Take  25 mg by mouth as needed., Disp: , Rfl:  .  ergocalciferol (VITAMIN D2) 50000 units capsule, Take 50,000 Units by mouth once a week., Disp: , Rfl:  .  esomeprazole (NEXIUM) 40 MG capsule, Take 40 mg by mouth 2 (two) times daily before a meal. , Disp: , Rfl:  .  fluticasone (FLONASE) 50 MCG/ACT nasal spray, Place 2 sprays into both nostrils daily as needed for allergies. , Disp: , Rfl:  .  hydrochlorothiazide (HYDRODIURIL) 25 MG tablet, Take 25 mg by mouth daily., Disp: , Rfl:  .  hydrOXYzine (ATARAX/VISTARIL) 50 MG tablet, Take 150 mg by mouth daily., Disp: , Rfl:  .  KETOCONAZOLE, TOPICAL, 1 % SHAM, Apply 1 Dose topically. 2-3 times per week, Disp: , Rfl:  .  Magnesium Hydroxide (MAGNESIA PO), Take 1 tablet by mouth daily., Disp: , Rfl:  .  Methenamine-Sodium Salicylate (CYSTEX PO),  Take 4 tablets by mouth daily., Disp: , Rfl:  .  Multiple Vitamins-Minerals (MULTIVITAMIN WITH MINERALS) tablet, Take 1 tablet by mouth daily., Disp: , Rfl:  .  ocrelizumab (OCREVUS) 300 MG/10ML injection, Inject into the vein once., Disp: , Rfl:  .  pentosan polysulfate (ELMIRON) 100 MG capsule, Take 100 mg by mouth 3 (three) times daily., Disp: , Rfl:  .  promethazine (PHENERGAN) 25 MG tablet, Take 25 mg by mouth every 6 (six) hours as needed for nausea or vomiting., Disp: , Rfl:  .  sucralfate (CARAFATE) 1 g tablet, Take 1 g by mouth as needed., Disp: , Rfl:  .  tiZANidine (ZANAFLEX) 2 MG tablet, Take 1 tablet (2 mg total) by mouth every 6 (six) hours as needed., Disp: 90 tablet, Rfl: 5 .  Trospium Chloride 60 MG CP24, Take 1 capsule by mouth daily. , Disp: , Rfl:  .  TURMERIC PO, Take 1 Dose by mouth daily., Disp: , Rfl:  .  venlafaxine XR (EFFEXOR-XR) 150 MG 24 hr capsule, Take 150 mg by mouth daily with breakfast., Disp: , Rfl:  .  vitamin B-12 (CYANOCOBALAMIN) 1000 MCG tablet, Take 1,000 mcg by mouth daily., Disp: , Rfl:  .  ARIPiprazole (ABILIFY) 5 MG tablet, Take 5 mg by mouth daily., Disp: , Rfl:   PAST  MEDICAL HISTORY: Past Medical History:  Diagnosis Date  . Anemia   . Anxiety   . Cancer ([place])   . Chronic pain syndrome   . Depression   . GERD (gastroesophageal reflux disease)   . Heart murmur   . Hypertension   . Interstitial cystitis   . Menopausal symptoms   . Mitral valve prolapse   . Multiple sclerosis ([place])    right side weakness/ uses cane occassionally  . MVP (mitral valve prolapse)    Pt states she was told this in the past but an Echo from [facility] in 2015 said pt did not have MVP  . Neuromuscular disorder ([place])   . Obesity (BMI 30-39.9)   . Palpitations   . PONV (postoperative nausea and vomiting)   . Sleep apnea    lost weight and does not use cpap anymore  . Vitamin D deficiency     PAST SURGICAL HISTORY: Past Surgical History:  Procedure Laterality Date  . ABDOMINAL HYSTERECTOMY    . APPENDECTOMY    . BLADDER SUSPENSION    . BREAST SURGERY    . CHOLECYSTECTOMY    . COLONOSCOPY WITH PROPOFOL N/A 10/21/2016   Procedure: COLONOSCOPY WITH PROPOFOL;  Surgeon: Manya Silvas, MD;  Location: [facility] ENDOSCOPY;  Service: Endoscopy;  Laterality: N/A;  . COLONOSCOPY WITH PROPOFOL N/A 02/06/2019   Procedure: COLONOSCOPY WITH PROPOFOL;  Surgeon: Manya Silvas, MD;  Location: [facility] ENDOSCOPY;  Service: Endoscopy;  Laterality: N/A;  . CYSTO WITH HYDRODISTENSION N/A 01/01/2015   Procedure: CYSTOSCOPY/HYDRODISTENSION;  Surgeon: Royston Cowper, MD;  Location: [facility] ORS;  Service: Urology;  Laterality: N/A;  . ESOPHAGOGASTRODUODENOSCOPY (EGD) WITH PROPOFOL N/A 10/21/2016   Procedure: ESOPHAGOGASTRODUODENOSCOPY (EGD) WITH PROPOFOL;  Surgeon: Manya Silvas, MD;  Location[facility] ENDOSCOPY;  Service: Endoscopy;  Laterality: N/A;  . ESOPHAGOGASTRODUODENOSCOPY (EGD) WITH PROPOFOL N/A 02/06/2019   Procedure: ESOPHAGOGASTRODUODENOSCOPY (EGD) WITH PROPOFOL;  Surgeon: Manya Silvas, MD;  Location: [facility] ENDOSCOPY;  Service: Endoscopy;  Laterality: N/A;  . JOINT REPLACEMENT Right   .  TUBAL LIGATION      FAMILY HISTORY: No family history on file.  SOCIAL HISTORY:  Social History   Socioeconomic  History  . Marital status: Married    Spouse name: Not on file  . Number of children: 2  . Years of education: BS  . Highest education level: Not on file  Occupational History  . Occupation: Retired   Tobacco Use  . Smoking status: Never Smoker  . Smokeless tobacco: Never Used  Vaping Use  . Vaping Use: Never used  Substance and Sexual Activity  . Alcohol use: No  . Drug use: No  . Sexual activity: Not on file  Other Topics Concern  . Not on file  Social History Narrative   Lives with husband   Caffeine use: some   Right handed    Social Determinants of Health   Financial Resource Strain: Not on file  Food Insecurity: Not on file  Transportation Needs: Not on file  Physical Activity: Not on file  Stress: Not on file  Social Connections: Not on file  Intimate Partner Violence: Not on file     PHYSICAL EXAM  Vitals:   10/23/20 0957  BP: (!) 159/90  Pulse: 97  SpO2: 98%  Weight: (!) 308 lb (139.7 kg)  Height: 5\' 7"  (1.702 m)    Body mass index is 48.24 kg/m.   General: The patient is well-developed and well-nourished and in no acute distress  HEENT:  Head is [place]/AT.  Sclera are anicteric.    Skin: Extremities are without rash or  edema.  Neurologic Exam  Mental status: The patient is alert and oriented x 3 at the time of the examination. The patient has apparent normal recent and remote memory, with an apparently normal attention span and concentration ability.   Speech is normal.  Cranial nerves: Extraocular movements are full. Nornal facial strength. . No obvious hearing deficits are noted.  Motor:  Muscle bulk is normal.   Tone is normal. Strength is  5 / 5 in all 4 extremities except 4/5 EHL, ankle ext on right  Sensory: Sensory testing is fairly symmetric to touch x 4.   Coordination: Cerebellar testing reveals good  finger-nose-finger and heel-to-shin bilaterally.  Gait and station: Station is normal.   The gait is arthritic.  It is mildly wide.  Tandem is poor.  Romberg is negative.   Reflexes: Deep tendon reflexes are symmetric and normal bilaterally.     DIAGNOSTIC DATA (LABS, IMAGING, TESTING) - I reviewed patient records, labs, notes, testing and imaging myself where available.  Lab Results  Component Value Date   WBC 10.2 08/07/2020   HGB 12.9 08/07/2020   HCT 38.4 08/07/2020   MCV 91 08/07/2020   PLT 347 08/07/2020      Component Value Date/Time   NA 141 11/16/2019 0948   NA 140 02/04/2012 0258   K 4.3 11/16/2019 0948   K 3.3 (L) 02/04/2012 0258   CL 103 11/16/2019 0948   CL 101 02/04/2012 0258   CO2 25 11/16/2019 0948   CO2 31 02/04/2012 0258   GLUCOSE 98 11/16/2019 0948   GLUCOSE 100 (H) 02/04/2012 0258   BUN 20 11/16/2019 0948   BUN 8 02/04/2012 0258   CREATININE 1.00 06/03/2020 0808   CREATININE 0.64 02/04/2012 0258   CALCIUM 9.1 11/16/2019 0948   CALCIUM 8.5 02/04/2012 0258   PROT 6.4 11/16/2019 0948   ALBUMIN 4.2 11/16/2019 0948   AST 13 11/16/2019 0948   ALT 17 11/16/2019 0948   ALKPHOS 130 (H) 11/16/2019 0948   BILITOT 0.4 11/16/2019 0948   GFRNONAA 64 11/16/2019 0948   GFRNONAA >  60 02/04/2012 0258   GFRAA 74 11/16/2019 0948   GFRAA >60 02/04/2012 0258       ASSESSMENT AND PLAN  Multiple sclerosis (HCC)  High risk medication use  Gait disturbance  Severe episode of recurrent major depressive disorder, without psychotic features ([place])   1.  Continue Ocrevus.  Later in the year consider MRI to determine if there is any subclinical progression.  2.  Continue baclofen, tizanidine.   3.  She has insomnia and hypersomnia associated with depression.   We discussed getting a 10000 Lux light box to reinforce the morning and take melatonin one hour before bedtime.   This may also help mood.  If not better should see psychiatry. 4.  Continue vitamin D. rtc 6  months or sooner if new or worsening symptoms  45-minute office visit with the majority of the time spent face-to-face for history and physical, discussion/counseling and decision-making.  Additional time with record review and documentation.   Navina Wohlers A. Felecia Shelling, MD, [facility] 1/0/0712, 19:75 AM Certified in Neurology, Clinical Neurophysiology, Sleep Medicine and Neuroimaging  [facility] [address] [place], [place] [address] [phone]

## 2020-12-10 ENCOUNTER — Telehealth: Payer: Self-pay | Admitting: *Deleted

## 2020-12-10 NOTE — Telephone Encounter (Signed)
Initiated PA diclofenac on CMM. Key: [id number]. Waiting on clinical questions to become available from [employer] and then will complete.

## 2020-12-11 NOTE — Telephone Encounter (Signed)
Submitted clinical questions. Received instant approval: [id number];Review Type:Prior Auth;Coverage Start Date:11/11/2020;Coverage End Date:12/11/2021;

## 2021-01-08 ENCOUNTER — Ambulatory Visit (INDEPENDENT_AMBULATORY_CARE_PROVIDER_SITE_OTHER): Payer: BC Managed Care – PPO | Admitting: Neurology

## 2021-01-08 ENCOUNTER — Encounter: Payer: Self-pay | Admitting: Neurology

## 2021-01-08 ENCOUNTER — Telehealth: Payer: Self-pay | Admitting: Neurology

## 2021-01-08 VITALS — BP 147/86 | HR 96 | Ht 67.0 in | Wt 298.5 lb

## 2021-01-08 DIAGNOSIS — R269 Unspecified abnormalities of gait and mobility: Secondary | ICD-10-CM

## 2021-01-08 DIAGNOSIS — G35 Multiple sclerosis: Secondary | ICD-10-CM

## 2021-01-08 DIAGNOSIS — F418 Other specified anxiety disorders: Secondary | ICD-10-CM

## 2021-01-08 DIAGNOSIS — F0781 Postconcussional syndrome: Secondary | ICD-10-CM | POA: Insufficient documentation

## 2021-01-08 DIAGNOSIS — Z79899 Other long term (current) drug therapy: Secondary | ICD-10-CM | POA: Diagnosis not present
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
## 2021-01-08 NOTE — Telephone Encounter (Signed)
Pt returned call. Please call back when available. 
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
## 2021-01-08 NOTE — Telephone Encounter (Signed)
Called pt to further discuss. LVM for her to call.
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
## 2021-01-10 ENCOUNTER — Other Ambulatory Visit: Payer: Self-pay

## 2021-01-10 ENCOUNTER — Ambulatory Visit
Admission: RE | Admit: 2021-01-10 | Discharge: 2021-01-10 | Disposition: A | Discharge status: Home / Self Care | Payer: BC Managed Care – PPO | Source: Ambulatory Visit | Attending: Neurology | Admitting: Neurology

## 2021-01-10 ENCOUNTER — Telehealth: Payer: Self-pay | Admitting: Neurology

## 2021-01-10 DIAGNOSIS — F0781 Postconcussional syndrome: Secondary | ICD-10-CM

## 2021-01-10 NOTE — Telephone Encounter (Signed)
Pt is asking that her [employer] Magic mouthwash gets called into [facility]

## 2021-01-13 ENCOUNTER — Other Ambulatory Visit: Payer: Self-pay | Admitting: Neurology

## 2021-01-13 MED ORDER — NYSTATIN 100000 UNIT/ML MT SUSP: 5.0000 mL | Freq: Every day | OROMUCOSAL | 1 refills | Status: DC | PRN

## 2021-01-13 NOTE — Telephone Encounter (Signed)
Dr. Felecia Shelling- I do not see previous rx written. Can you advise on how you would like to order? We will need to print/fax to pharmacy

## 2021-01-13 NOTE — Telephone Encounter (Signed)
Rx printed, waiting on MD signature and then will fax to pharmacy

## 2021-01-13 NOTE — Progress Notes (Unsigned)
x

## 2021-01-13 NOTE — Telephone Encounter (Signed)
30 milliliters (ml) of nystatin suspension at 100,000 units/ml, or 3 million units of nystatin powder 60 milligrams hydrocortisone enough diphenhydramine HCL syrup to bring the total volume up to 240 ml   5 cc swish and swallow daily prn

## 2021-01-13 NOTE — Addendum Note (Signed)
Addended by: Wyvonnia Lora on: 01/13/2021 01:03 PM   Modules accepted: Orders

## 2021-01-13 NOTE — Telephone Encounter (Signed)
Faxed printed/signed rx to [phone]. Received fax confirmation.

## 2021-01-15 ENCOUNTER — Telehealth: Payer: Self-pay | Admitting: Neurology

## 2021-01-15 NOTE — Telephone Encounter (Signed)
Called pt. She is taking baclofen 10mg  po TID and tizanidine 2mg , 1-2 per day. Advised I will discuss w/ MD and call her back.

## 2021-01-15 NOTE — Telephone Encounter (Addendum)
Per Dr. Felecia Shelling, she can increase tizanidine to up to 4 per day to see if this helps. I called and LVM for pt with this recommendation. Advised her to call back if she does not notice any improvement after trying this.

## 2021-01-15 NOTE — Telephone Encounter (Signed)
Pt is asking if she can have an increase in the dose of her muscle relaxer, please call.

## 2021-01-15 NOTE — Telephone Encounter (Signed)
Called pt. Relayed below info. She verbalized understanding and appreciation.

## 2021-01-15 NOTE — Telephone Encounter (Signed)
[facility]. They confirmed we received rx sent on 01/13/21 and it is for D[employer] Magic Mouthwash. It is ready for pick up.

## 2021-01-15 NOTE — Telephone Encounter (Signed)
Pt had requested the  [facility] Magic mouthwash gets called into [facility] , no other pharmacy makes it.

## 2021-02-20 ENCOUNTER — Other Ambulatory Visit: Payer: Self-pay | Admitting: *Deleted

## 2021-02-20 MED ORDER — BACLOFEN 10 MG PO TABS: 10.0000 mg | ORAL_TABLET | Freq: Three times a day (TID) | ORAL | 3 refills | Status: DC

## 2021-04-14 ENCOUNTER — Other Ambulatory Visit: Payer: Self-pay

## 2021-04-14 MED ORDER — TIZANIDINE HCL 2 MG PO TABS
2.0000 mg | ORAL_TABLET | Freq: Four times a day (QID) | ORAL | 1 refills | Status: DC | PRN
Start: 2021-04-14 — End: 2021-04-15

## 2021-04-15 ENCOUNTER — Other Ambulatory Visit: Payer: Self-pay | Admitting: *Deleted

## 2021-04-15 MED ORDER — TIZANIDINE HCL 2 MG PO TABS: 2.0000 mg | ORAL_TABLET | Freq: Four times a day (QID) | ORAL | 1 refills | Status: DC | PRN

## 2021-04-24 ENCOUNTER — Telehealth: Payer: Self-pay | Admitting: *Deleted

## 2021-04-24 ENCOUNTER — Other Ambulatory Visit: Payer: Self-pay

## 2021-04-24 ENCOUNTER — Encounter: Payer: Self-pay | Admitting: Neurology

## 2021-04-24 ENCOUNTER — Ambulatory Visit: Payer: BC Managed Care – PPO | Admitting: Neurology

## 2021-04-24 VITALS — BP 167/74 | HR 94 | Ht 67.0 in | Wt 304.0 lb

## 2021-04-24 DIAGNOSIS — G35 Multiple sclerosis: Secondary | ICD-10-CM

## 2021-04-24 DIAGNOSIS — Z79899 Other long term (current) drug therapy: Secondary | ICD-10-CM

## 2021-04-24 DIAGNOSIS — R269 Unspecified abnormalities of gait and mobility: Secondary | ICD-10-CM | POA: Diagnosis not present

## 2021-04-24 DIAGNOSIS — F418 Other specified anxiety disorders: Secondary | ICD-10-CM | POA: Diagnosis not present

## 2021-04-24 DIAGNOSIS — G4700 Insomnia, unspecified: Secondary | ICD-10-CM

## 2021-04-24 DIAGNOSIS — E559 Vitamin D deficiency, unspecified: Secondary | ICD-10-CM

## 2021-04-24 MED ORDER — NYSTATIN 100000 UNIT/ML MT SUSP: 5.0000 mL | Freq: Every day | OROMUCOSAL | 5 refills | Status: DC | PRN

## 2021-04-24 MED ORDER — ERGOCALCIFEROL 1.25 MG (50000 UT) PO CAPS: 50000.0000 [IU] | ORAL_CAPSULE | ORAL | 3 refills | Status: DC

## 2021-04-24 NOTE — Telephone Encounter (Signed)
Faxed printed/signed rx magic mouthwash to [facility] at [phone]. Received fax confirmation.
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
## 2021-04-25 LAB — CBC WITH DIFFERENTIAL/PLATELET
Basophils Absolute: 0.1 10*3/uL (ref 0.0–0.2)
Basos: 1 %
EOS (ABSOLUTE): 0.2 10*3/uL (ref 0.0–0.4)
Eos: 2 %
Hematocrit: 35.8 % (ref 34.0–46.6)
Hemoglobin: 12 g/dL (ref 11.1–15.9)
Immature Grans (Abs): 0.1 10*3/uL (ref 0.0–0.1)
Immature Granulocytes: 1 %
Lymphocytes Absolute: 2.7 10*3/uL (ref 0.7–3.1)
Lymphs: 27 %
MCH: 31.3 pg (ref 26.6–33.0)
MCHC: 33.5 g/dL (ref 31.5–35.7)
MCV: 94 fL (ref 79–97)
Monocytes Absolute: 0.8 10*3/uL (ref 0.1–0.9)
Monocytes: 8 %
Neutrophils Absolute: 6 10*3/uL (ref 1.4–7.0)
Neutrophils: 61 %
Platelets: 389 10*3/uL (ref 150–450)
RBC: 3.83 x10E6/uL (ref 3.77–5.28)
RDW: 13.8 % (ref 11.7–15.4)
WBC: 9.9 10*3/uL (ref 3.4–10.8)

## 2021-04-25 LAB — IGG, IGA, IGM
IgA/Immunoglobulin A, Serum: 147 mg/dL (ref 87–352)
IgG (Immunoglobin G), Serum: 553 mg/dL — ABNORMAL LOW (ref 586–1602)
IgM (Immunoglobulin M), Srm: 26 mg/dL (ref 26–217)

## 2021-04-29 ENCOUNTER — Telehealth: Payer: Self-pay | Admitting: Neurology

## 2021-04-29 NOTE — Telephone Encounter (Signed)
medicarwe/bcbs auth: [employer] spoke to Claudia Pollock ref # [id number] order sent to GI. They will reach out to the patient to schedule.

## 2021-05-01 ENCOUNTER — Ambulatory Visit: Payer: Medicare Other | Admitting: Family Medicine

## 2021-05-11 ENCOUNTER — Other Ambulatory Visit: Payer: Self-pay | Admitting: Neurology

## 2021-05-15 ENCOUNTER — Ambulatory Visit
Admission: RE | Admit: 2021-05-15 | Discharge: 2021-05-15 | Disposition: A | Discharge status: Home / Self Care | Payer: BC Managed Care – PPO | Source: Ambulatory Visit | Attending: Neurology | Admitting: Neurology

## 2021-05-15 ENCOUNTER — Other Ambulatory Visit: Payer: Self-pay

## 2021-05-15 DIAGNOSIS — G35 Multiple sclerosis: Secondary | ICD-10-CM

## 2021-05-15 DIAGNOSIS — Z79899 Other long term (current) drug therapy: Secondary | ICD-10-CM

## 2021-05-15 MED ORDER — GADOBENATE DIMEGLUMINE 529 MG/ML IV SOLN
20.0000 mL | Freq: Once | INTRAVENOUS | Status: AC | PRN
  Administered 2021-05-15: 20 mL via INTRAVENOUS

## 2021-07-31 ENCOUNTER — Encounter: Payer: Self-pay | Admitting: Neurology

## 2021-08-15 IMAGING — CT CT NECK W/ CM
3 of 4 series · 14 of 35 positions shown, 17 images · IV contrast (omnipaque)
Comparison: 12/20/2019 MRI head.  11/02/2018 CTA chest.

CLINICAL DATA: Right ear pain.

EXAM:
CT NECK WITH CONTRAST
TECHNIQUE: Multidetector CT imaging of the neck was performed using the
standard protocol following the bolus administration of intravenous
contrast.
CONTRAST:  75mL OMNIPAQUE IOHEXOL 300 MG/ML  SOLN

[Series 5: sag neck · sagittal · 0.47mm/px · 5 of 133 slices shown, 6 images]
[im 45/133  bone]
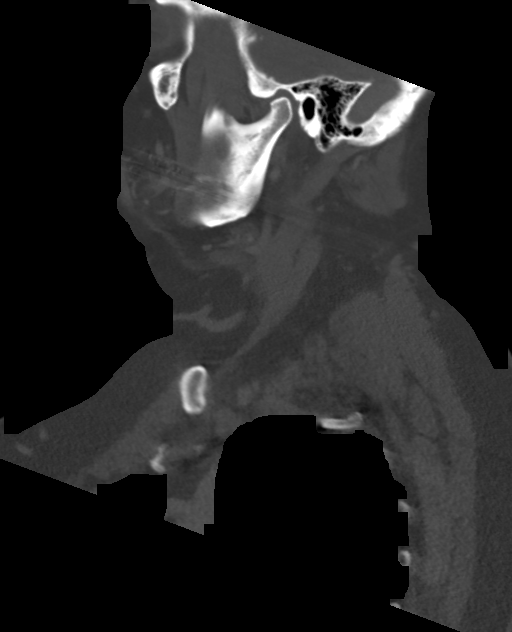
[im 56/133  bone]
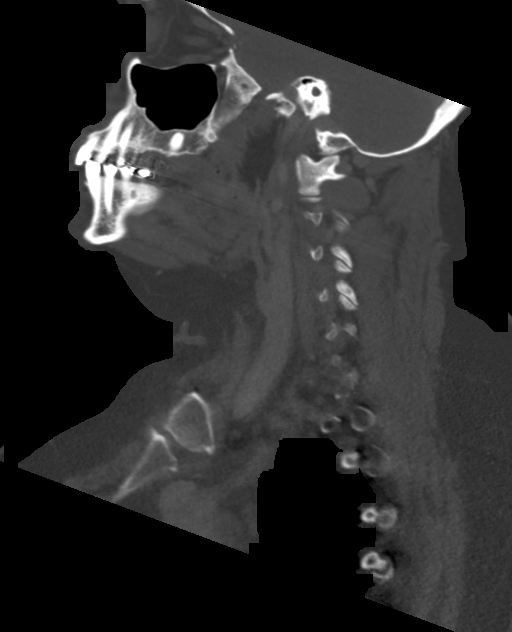
[im 67/133  soft-tissue]
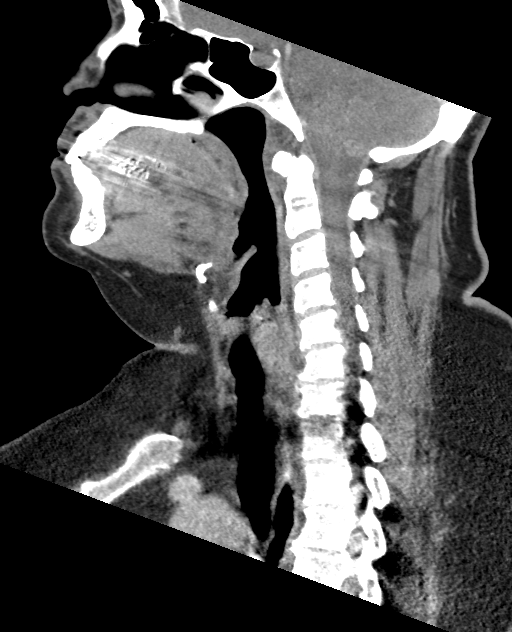
[im 67/133  bone]
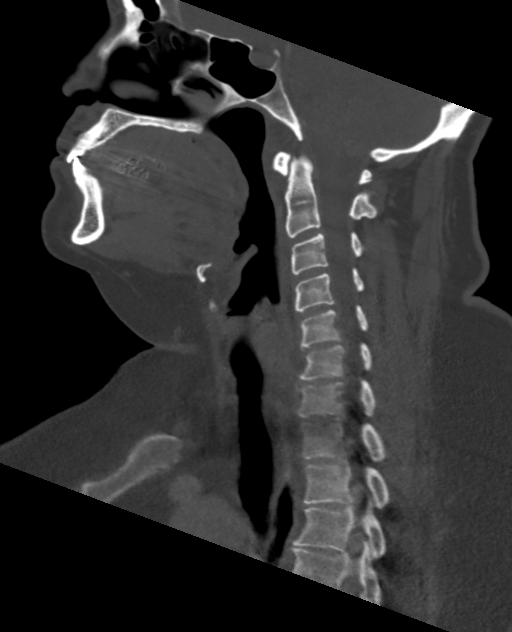
[im 78/133  bone]
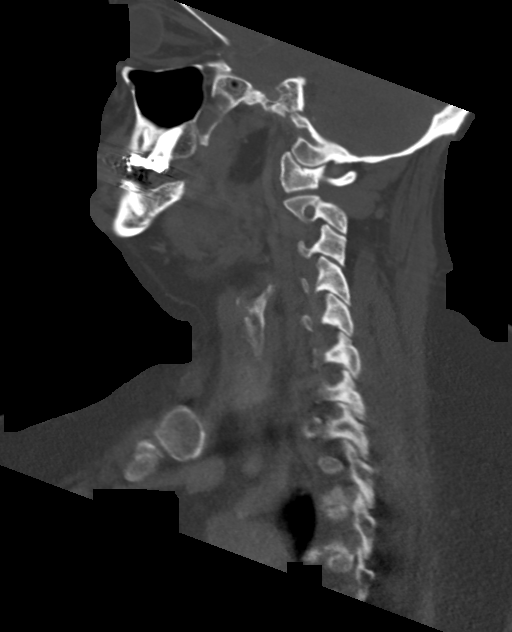
[im 89/133  bone]
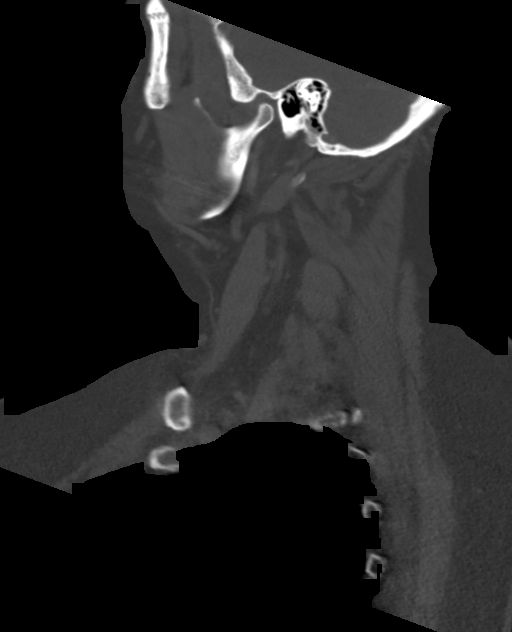

[Series 6: cor neck · coronal · 0.52mm/px · 3 of 120 slices shown]
[im 39/120  bone]
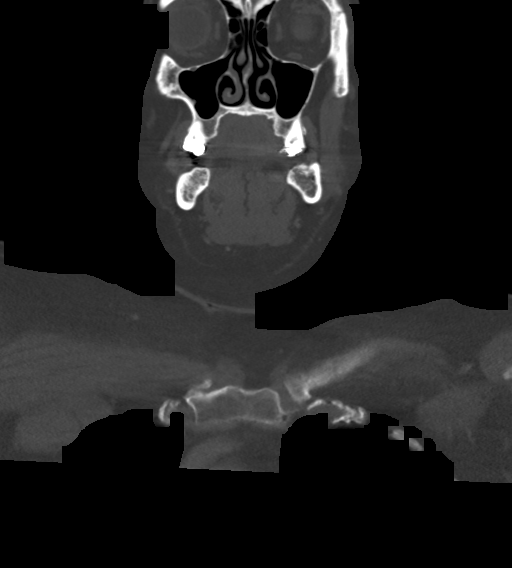
[im 53/120  bone]
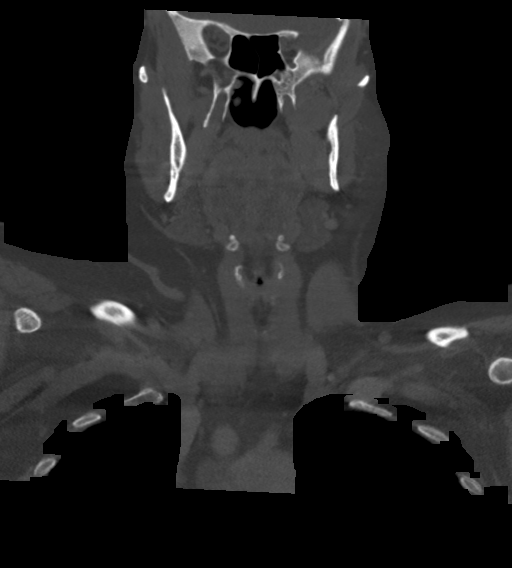
[im 67/120  bone]
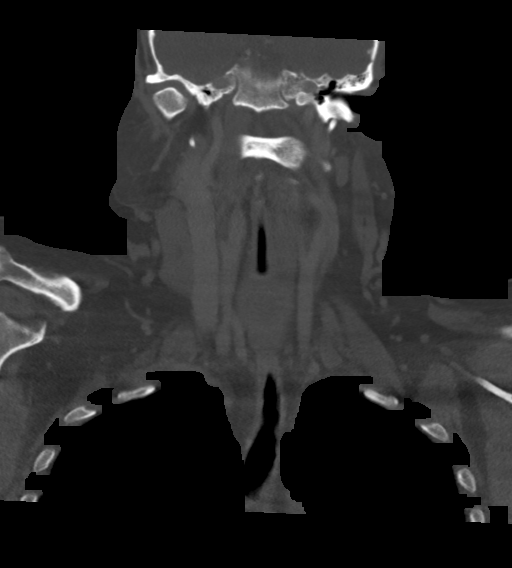

[Series 7: orthogonal ax · axial · 0.47mm/px · z∈[-363,-166]mm · 6 of 148 slices shown, 8 images]
[im 22/148  soft-tissue]
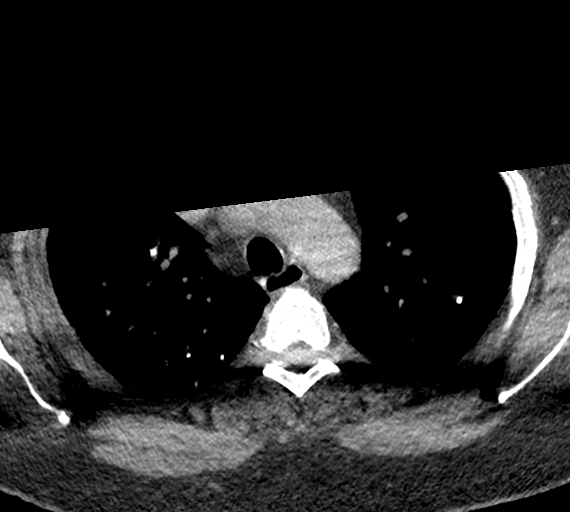
[im 22/148  bone]
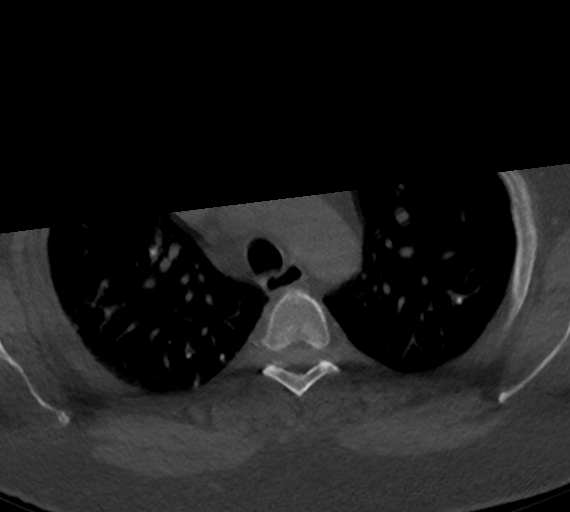
[im 43/148  bone]
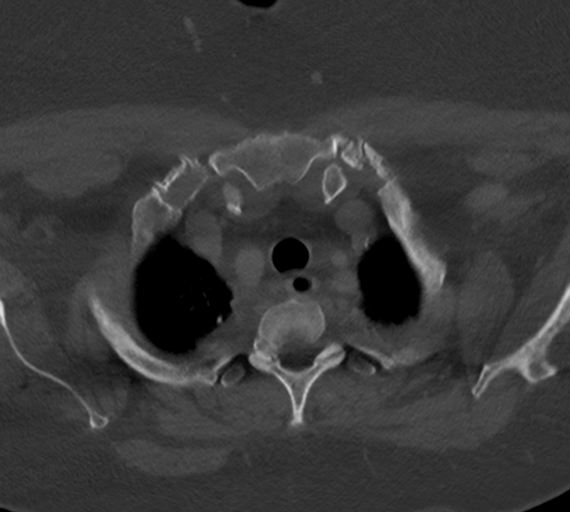
[im 64/148  bone]
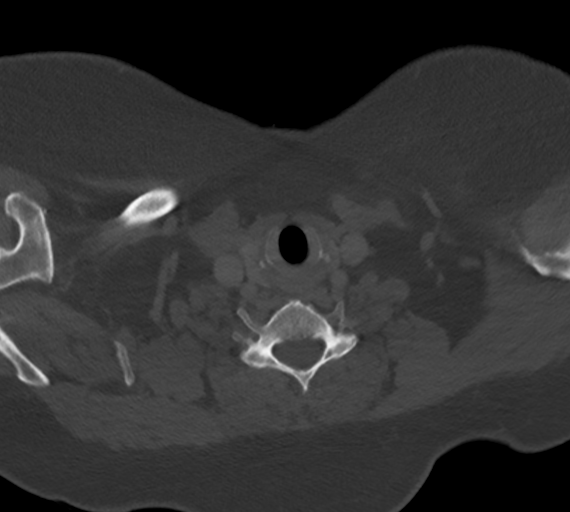
[im 85/148  bone]
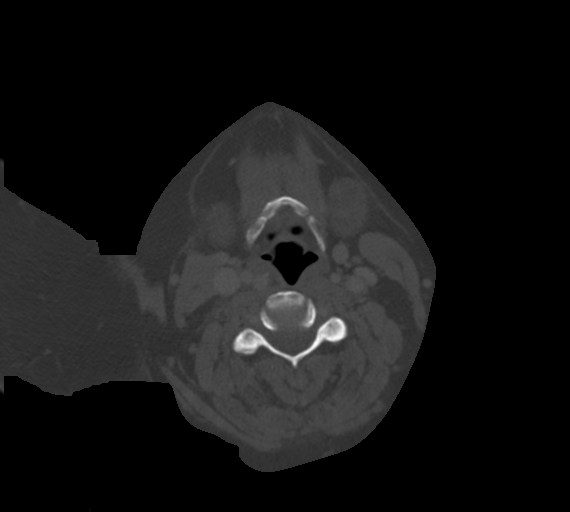
[im 106/148  soft-tissue]
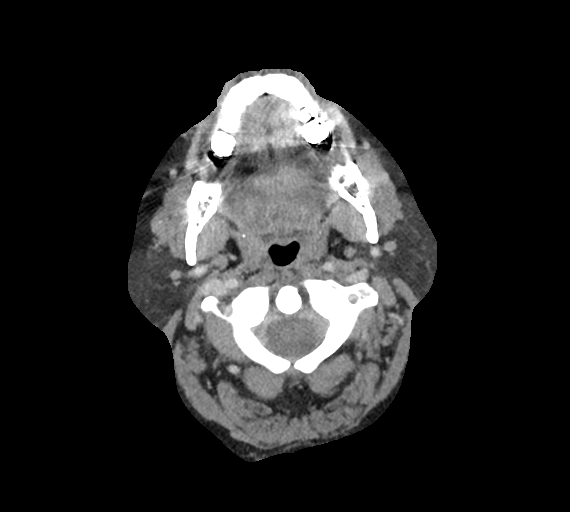
[im 106/148  bone]
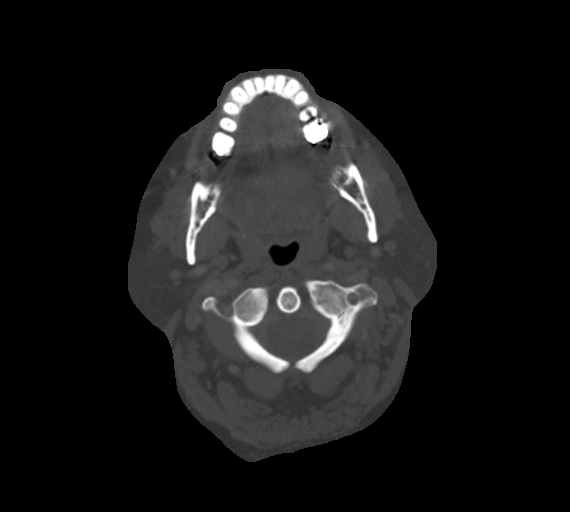
[im 127/148  bone]
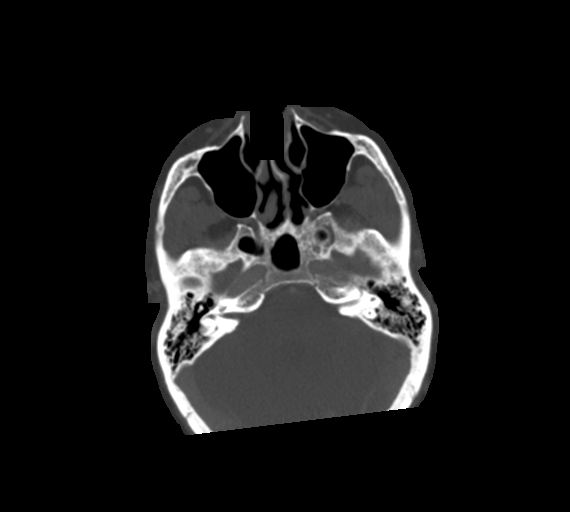

[14 of 35 positions shown; findings below may reference images not displayed]

FINDINGS: Pharynx and larynx: Normal. No mass or swelling. Dental amalgam beam
hardening artifact limits evaluation.

Salivary glands: No inflammation, mass, or stone.

Thyroid: Normal.

Lymph nodes: None enlarged or abnormal density.

Vascular: Normal intravascular enhancement.

Limited intracranial: Grossly unremarkable.

Visualized orbits: Normal orbits.

Mastoids and visualized paranasal sinuses: Clear paranasal sinuses.
No mastoid effusion.

Skeleton: No acute or suspicious osseous abnormalities.

Upper chest: Biapical calcified granulomas and atelectasis.

Other: Postprocedural appearance of the nasal soft tissues and right
platysma.
IMPRESSION: No focal finding to explain patient's symptoms.

No adenopathy or abnormal enhancement.

Biapical calcified granulomas.

## 2021-08-21 ENCOUNTER — Other Ambulatory Visit: Payer: Self-pay | Admitting: Neurology

## 2021-08-25 ENCOUNTER — Encounter: Payer: Self-pay | Admitting: Neurology

## 2021-09-04 ENCOUNTER — Other Ambulatory Visit: Payer: Self-pay | Admitting: *Deleted

## 2021-09-04 ENCOUNTER — Encounter: Payer: Self-pay | Admitting: Neurology

## 2021-09-04 MED ORDER — NYSTATIN 100000 UNIT/ML MT SUSP: 5.0000 mL | Freq: Every day | OROMUCOSAL | 5 refills | Status: DC | PRN

## 2021-10-23 ENCOUNTER — Ambulatory Visit: Payer: BC Managed Care – PPO | Admitting: Neurology

## 2021-10-23 ENCOUNTER — Encounter: Payer: Self-pay | Admitting: Neurology

## 2021-10-23 ENCOUNTER — Other Ambulatory Visit: Payer: Self-pay

## 2021-10-23 VITALS — BP 142/80 | HR 89 | Ht 67.0 in | Wt 308.0 lb

## 2021-10-23 DIAGNOSIS — G35 Multiple sclerosis: Secondary | ICD-10-CM

## 2021-10-23 DIAGNOSIS — R269 Unspecified abnormalities of gait and mobility: Secondary | ICD-10-CM | POA: Diagnosis not present

## 2021-10-23 DIAGNOSIS — Z79899 Other long term (current) drug therapy: Secondary | ICD-10-CM

## 2021-10-23 DIAGNOSIS — F418 Other specified anxiety disorders: Secondary | ICD-10-CM | POA: Diagnosis not present

## 2021-10-23 DIAGNOSIS — G4700 Insomnia, unspecified: Secondary | ICD-10-CM

## 2021-10-23 MED ORDER — GABAPENTIN 300 MG PO CAPS: 300.0000 mg | ORAL_CAPSULE | Freq: Three times a day (TID) | ORAL | 11 refills | Status: DC

## 2021-10-23 NOTE — Progress Notes (Signed)
[facility]  PATIENT: Linda Sanchez DOB: [birth year]  REFERRING DOCTOR OR PCP: Linda Posey, FNP; was seeing Linda Sanchez [facility] neurology) SOURCE: Patient, notes from Dr. Jalene Sanchez, imaging reports, MRI images from 2008 and 2020 personally reviewed  _________________________________   HISTORICAL  CHIEF COMPLAINT:  Chief Complaint  Patient presents with   Follow-up    RM 1 alone here for 6 month f/u on MS, last infusion was 04/22/22 and next infusion scheduled for 11/04/21. Pt reports she feels like her balance has been off and brain fog has been an issue for her.     HISTORY OF PRESENT ILLNESS:  Linda Sanchez is a 61 y.o. woman with relapsing remitting multiple sclerosis.  Update 10/23/2021: She is on Ocrevus and has tolerated it well.   Her next infusion is in 2 weeks.   She denies MS exacerbations.      Gait is stable.   She uses a cane due to poor balance   Some stumbles but no falls.  She reports mild right arm and leg  weakness   She has muscle spasms in her legs and sometimes has jerks as well.   Baclofen and tizanidine help the spasticity but tizanidie makes her very sleepy and she stopped.   Baclofen is tolerated better but spasticity did better on tizanidine.     No issues with numbness though has occasional limb tingling,    She had blisters in her mouth helped by [employer] magic mouthwash.   She has a burning tongue,, at times, worse with spicy foos.    She was once on gabapentin but stopped when she had stomach issues (may also have been on an NSAID at the time).       She reports urinary urgency and rare incontinence.   She has been diagnosed with interstitial cystitis.   She reports a lot of fatigue.   She has insomnia and nighttime insomnia.   She takes 150 mg hydroxyzine and melatonin at bedtime.    She stays up late falls asleep at 1 am gets 3 hours sleep then mya have a nap later.     She has depression, worse with the recent reduced  health of her husband (prostate cancer) .   She is on venlafaxine, Abilify and Trintellix.  Linda Sanchez (PCP) is prescribing.    She has therpaist but has not seen in a while.    Last year, she did TMS treatments with some benefit for a few months.   MS History: She is a 61 year old woman who had tongue numbness in 2008.  She also reported visual blurriness.   She was referred to Dr. Jalene Sanchez.   She had a LP as well and she reports it ws consistent with MS.     She was placed on Avonex injections.   She then switched to [place].   However, she had melanoma and stopped for a few months.   Follow-up MRI reportedly showed additional lesions and she was switched to [place] since January 2020.   Her last infusion was January 2021 and she will need her next one July 2021.   She feels wiped out for a day or two after each infusion.     MRI of the brain dated 05/26/2007 [facility]) it shows multiple T2/FLAIR hyperintense foci, predominantly in the subcortical and deep white matter of the hemispheres.  None of these enhance.  These are mostly nonspecific.  A couple small foci were in the  periventricular white matter.    MRI of the brain and cervical spine from 10/18/2018.   The MRI of the cervical spine showed minimal degenerative changes but no nerve root compression.  The spinal cord appeared normal.   MRI of the brain showed T2 hyperintense foci within the right pons (not seen in 2008) and in the hemispheres with some progression compared to the 2008 MRI.  A few foci were in the periventricular white matter though most were nonspecific in the subcortical and deep white matter.  MRI brain 05/15/2021 showed Scattered T2/FLAIR hyperintense foci in the hemispheres and pons.  Some of the foci in the periventricular and juxtacortical white matter have an appearance more consistent with demyelination associated with multiple sclerosis while other foci are more nonspecific and could be due to  chronic microvascular ischemic change.  None of the foci appear to be acute.  They do not enhance.  Compared to the MRI dated 12/20/2019, there are no new lesions.   REVIEW OF SYSTEMS: Constitutional: No fevers, chills, sweats, or change in appetite Eyes: No visual changes, double vision, eye pain Ear, nose and throat: No hearing loss, ear pain, nasal congestion, sore throat Cardiovascular: No chest pain, palpitations Respiratory:  No shortness of breath at rest or with exertion.   No wheezes GastrointestinaI: No nausea, vomiting, diarrhea, abdominal pain, fecal incontinence Genitourinary:  No dysuria, urinary retention or frequency.  No nocturia. Musculoskeletal:  No neck pain, back pain Integumentary: No rash, pruritus, skin lesions Neurological: as above Psychiatric: Has depression > anxiety Endocrine: No palpitations, diaphoresis, change in appetite, change in weigh or increased thirst Hematologic/Lymphatic:  No anemia, purpura, petechiae. Allergic/Immunologic: No itchy/runny eyes, nasal congestion, recent allergic reactions, rashes  ALLERGIES: Allergies  Allergen Reactions   Amoxicillin-Pot Clavulanate Nausea Only   Biaxin [Clarithromycin] Photosensitivity   Buprenorphine Hcl Nausea And Vomiting   Ciprofloxacin    Ibuprofen Other (See Comments)    Gi problems   Macrobid [Nitrofurantoin Macrocrystal] Nausea Only   Morphine And Related Nausea And Vomiting   Nsaids    Sulfa Antibiotics    Testim [Testosterone]    Bactrim [Sulfamethoxazole-Trimethoprim] Rash   Floxin [Ofloxacin] Rash    HOME MEDICATIONS:  Current Outpatient Medications:    acetaminophen (TYLENOL) 500 MG tablet, Take 1,000 mg by mouth every 6 (six) hours as needed., Disp: , Rfl:    amoxicillin (AMOXIL) 500 MG capsule, Take 500 mg by mouth once. Take 4 capsules orally 1 hour prior to appt, Disp: , Rfl:    atorvastatin (LIPITOR) 20 MG tablet, Take 20 mg by mouth daily., Disp: , Rfl:    baclofen (LIORESAL) 10  MG tablet, Take 1 tablet (10 mg total) by mouth 3 (three) times daily., Disp: 270 each, Rfl: 3   clobetasol cream (TEMOVATE) 7.61 %, Apply 1 application topically 2 (two) times daily., Disp: , Rfl:    cyclobenzaprine (FLEXERIL) 5 MG tablet, Take 5 mg by mouth 3 (three) times daily as needed for muscle spasms., Disp: , Rfl:    diclofenac Sodium (VOLTAREN) 1 % GEL, APPLY 2 GRAMS TOPICALLY AS NEEDED., Disp: 300 g, Rfl: 3   diphenhydrAMINE (BENADRYL) 25 MG tablet, Take 25 mg by mouth as needed., Disp: , Rfl:    ergocalciferol (VITAMIN D2) 1.25 MG (50000 UT) capsule, Take 1 capsule (50,000 Units total) by mouth once a week., Disp: 13 capsule, Rfl: 3   esomeprazole (NEXIUM) 40 MG capsule, Take 40 mg by mouth 2 (two) times daily before a meal. , Disp: ,  Rfl:    fluticasone (FLONASE) 50 MCG/ACT nasal spray, Place 2 sprays into both nostrils daily as needed for allergies. , Disp: , Rfl:    gabapentin (NEURONTIN) 300 MG capsule, Take 1 capsule (300 mg total) by mouth 3 (three) times daily., Disp: 90 capsule, Rfl: 11   hydrochlorothiazide (HYDRODIURIL) 25 MG tablet, Take 25 mg by mouth daily., Disp: , Rfl:    hydrOXYzine (ATARAX/VISTARIL) 50 MG tablet, Take 150 mg by mouth daily., Disp: , Rfl:    KETOCONAZOLE, TOPICAL, 1 % SHAM, Apply 1 Dose topically. 2-3 times per week, Disp: , Rfl:    magic mouthwash (multi-ingredient) oral suspension, Swish and swallow 5 mLs daily as needed for mouth pain. 30 milliliters (ml) of nystatin suspension at 100,000 units/ml, or 3 million units of nystatin powder 60 milligrams hydrocortisone. Enough diphenhydramine HCL syrup to bring the total volume up to 240 ml, Disp: 240 mL, Rfl: 5   Magnesium Hydroxide (MAGNESIA PO), Take 1 tablet by mouth daily., Disp: , Rfl:    Methenamine-Sodium Salicylate (CYSTEX PO), Take 4 tablets by mouth daily., Disp: , Rfl:    Multiple Vitamins-Minerals (MULTIVITAMIN WITH MINERALS) tablet, Take 1 tablet by mouth daily., Disp: , Rfl:    ocrelizumab  (OCREVUS) 300 MG/10ML injection, Inject into the vein once., Disp: , Rfl:    pentosan polysulfate (ELMIRON) 100 MG capsule, Take 100 mg by mouth 3 (three) times daily., Disp: , Rfl:    promethazine (PHENERGAN) 25 MG tablet, Take 25 mg by mouth every 6 (six) hours as needed for nausea or vomiting., Disp: , Rfl:    sucralfate (CARAFATE) 1 g tablet, Take 1 g by mouth as needed., Disp: , Rfl:    tiZANidine (ZANAFLEX) 2 MG tablet, Take 1 tablet (2 mg total) by mouth every 6 (six) hours as needed., Disp: 360 tablet, Rfl: 1   Trospium Chloride 60 MG CP24, Take 1 capsule by mouth daily. , Disp: , Rfl:    TURMERIC PO, Take 1 Dose by mouth daily., Disp: , Rfl:    venlafaxine XR (EFFEXOR-XR) 150 MG 24 hr capsule, Take 150 mg by mouth daily with breakfast., Disp: , Rfl:    Vibegron (GEMTESA) 75 MG TABS, Take by mouth., Disp: , Rfl:    vitamin B-12 (CYANOCOBALAMIN) 1000 MCG tablet, Take 1,000 mcg by mouth daily., Disp: , Rfl:    ARIPiprazole (ABILIFY) 5 MG tablet, Take 5 mg by mouth daily., Disp: , Rfl:   PAST MEDICAL HISTORY: Past Medical History:  Diagnosis Date   Anemia    Anxiety    Cancer ([place])    Chronic pain syndrome    Depression    GERD (gastroesophageal reflux disease)    Heart murmur    Hypertension    Interstitial cystitis    Menopausal symptoms    Mitral valve prolapse    Multiple sclerosis (HCC)    right side weakness/ uses cane occassionally   MVP (mitral valve prolapse)    Pt states she was told this in the past but an Echo from [facility] in 2015 said pt did not have MVP   Neuromuscular disorder ([place])    Obesity (BMI 30-39.9)    Palpitations    PONV (postoperative nausea and vomiting)    Sleep apnea    lost weight and does not use cpap anymore   Vitamin D deficiency     PAST SURGICAL HISTORY: Past Surgical History:  Procedure Laterality Date   ABDOMINAL HYSTERECTOMY     APPENDECTOMY  BLADDER SUSPENSION     BREAST SURGERY     CHOLECYSTECTOMY     COLONOSCOPY WITH PROPOFOL  N/A 10/21/2016   Procedure: COLONOSCOPY WITH PROPOFOL;  Surgeon: Manya Silvas, MD;  Location: [facility] ENDOSCOPY;  Service: Endoscopy;  Laterality: N/A;   COLONOSCOPY WITH PROPOFOL N/A 02/06/2019   Procedure: COLONOSCOPY WITH PROPOFOL;  Surgeon: Manya Silvas, MD;  Location: [facility] ENDOSCOPY;  Service: Endoscopy;  Laterality: N/A;   CYSTO WITH HYDRODISTENSION N/A 01/01/2015   Procedure: CYSTOSCOPY/HYDRODISTENSION;  Surgeon: Royston Cowper, MD;  Location: [facility] ORS;  Service: Urology;  Laterality: N/A;   ESOPHAGOGASTRODUODENOSCOPY (EGD) WITH PROPOFOL N/A 10/21/2016   Procedure: ESOPHAGOGASTRODUODENOSCOPY (EGD) WITH PROPOFOL;  Surgeon: Manya Silvas, MD;  Location: [facility] ENDOSCOPY;  Service: Endoscopy;  Laterality: N/A;   ESOPHAGOGASTRODUODENOSCOPY (EGD) WITH PROPOFOL N/A 02/06/2019   Procedure: ESOPHAGOGASTRODUODENOSCOPY (EGD) WITH PROPOFOL;  Surgeon: Manya Silvas, MD;  Location: [facility] ENDOSCOPY;  Service: Endoscopy;  Laterality: N/A;   JOINT REPLACEMENT Right    TUBAL LIGATION      FAMILY HISTORY: No family history on file.  SOCIAL HISTORY:  Social History   Socioeconomic History   Marital status: Married    Spouse name: Not on file   Number of children: 2   Years of education: BS   Highest education level: Not on file  Occupational History   Occupation: Retired   Tobacco Use   Smoking status: Never   Smokeless tobacco: Never  Vaping Use   Vaping Use: Never used  Substance and Sexual Activity   Alcohol use: No   Drug use: No   Sexual activity: Not on file  Other Topics Concern   Not on file  Social History Narrative   Lives with husband   Caffeine use: some   Right handed    Social Determinants of Health   Financial Resource Strain: Not on file  Food Insecurity: Not on file  Transportation Needs: Not on file  Physical Activity: Not on file  Stress: Not on file  Social Connections: Not on file  Intimate Partner Violence: Not on file     PHYSICAL  EXAM  Vitals:   10/23/21 0820  BP: (!) 142/80  Pulse: 89  SpO2: 96%  Weight: (!) 308 lb (139.7 kg)  Height: 5\' 7"  (1.702 m)    Body mass index is 48.24 kg/m.   General: The patient is well-developed and well-nourished and in no acute distress.    HEENT:  Head is Anna/AT.  Sclera are anicteric.    Skin: Extremities are without rash or  edema.  Neurologic Exam  Mental status: The patient is alert and oriented x 3 at the time of the examination. The patient has apparent normal recent and remote memory, with an apparently normal attention span and concentration ability.   Speech is normal.  Cranial nerves: Extraocular movements are full. Nornal facial strength. . No obvious hearing deficits are noted.  Motor:  Muscle bulk is normal.   Tone is normal. Strength is  5 / 5 in all 4 extremities except 4/5 EHL, ankle ext on right  Sensory: Sensory testing is fairly symmetric to touch x 4.   Coordination: Cerebellar testing reveals good finger-nose-finger and heel-to-shin bilaterally.  Gait and station: Station is normal.   She can walk without her cane but does better with it.  Gait is arthritic and wide with small stride.   The tandem gait is poor.  The Romberg is negative.  Reflexes: Deep tendon reflexes are symmetric and  normal bilaterally.     DIAGNOSTIC DATA (LABS, IMAGING, TESTING) - I reviewed patient records, labs, notes, testing and imaging myself where available.  Lab Results  Component Value Date   WBC 9.9 04/24/2021   HGB 12.0 04/24/2021   HCT 35.8 04/24/2021   MCV 94 04/24/2021   PLT 389 04/24/2021      Component Value Date/Time   NA 141 11/16/2019 0948   NA 140 02/04/2012 0258   K 4.3 11/16/2019 0948   K 3.3 (L) 02/04/2012 0258   CL 103 11/16/2019 0948   CL 101 02/04/2012 0258   CO2 25 11/16/2019 0948   CO2 31 02/04/2012 0258   GLUCOSE 98 11/16/2019 0948   GLUCOSE 100 (H) 02/04/2012 0258   BUN 20 11/16/2019 0948   BUN 8 02/04/2012 0258   CREATININE  1.00 06/03/2020 0808   CREATININE 0.64 02/04/2012 0258   CALCIUM 9.1 11/16/2019 0948   CALCIUM 8.5 02/04/2012 0258   PROT 6.4 11/16/2019 0948   ALBUMIN 4.2 11/16/2019 0948   AST 13 11/16/2019 0948   ALT 17 11/16/2019 0948   ALKPHOS 130 (H) 11/16/2019 0948   BILITOT 0.4 11/16/2019 0948   GFRNONAA 64 11/16/2019 0948   GFRNONAA >60 02/04/2012 0258   GFRAA 74 11/16/2019 0948   GFRAA >60 02/04/2012 0258       ASSESSMENT AND PLAN  Multiple sclerosis (HCC)  High risk medication use  Gait disturbance  Depression with anxiety  Insomnia, unspecified type   1.  For MS, Continue Ocrevus.  Check labs today.     Last MRI was stable, will check again in 2024.    2.  Continue baclofen scheduled (2-3 times daily) and tizanidine for worse pain..  3.  Continue [employer] Magic mouthwash and add gabapentin for burning tongue. 4. rtc 6 months or sooner if new or worsening symptoms    Shriley Joffe A. Felecia Shelling, MD, [facility] 04/01/3733, 2:87 AM Certified in Neurology, Clinical Neurophysiology, Sleep Medicine and Neuroimaging  [facility] [address] [place], [place] [address] [phone]

## 2021-10-26 LAB — CBC WITH DIFFERENTIAL/PLATELET
Basophils Absolute: 0.1 10*3/uL (ref 0.0–0.2)
Basos: 1 %
EOS (ABSOLUTE): 0.7 10*3/uL — ABNORMAL HIGH (ref 0.0–0.4)
Eos: 8 %
Hematocrit: 37.1 % (ref 34.0–46.6)
Hemoglobin: 12.6 g/dL (ref 11.1–15.9)
Immature Grans (Abs): 0 10*3/uL (ref 0.0–0.1)
Immature Granulocytes: 0 %
Lymphocytes Absolute: 2.3 10*3/uL (ref 0.7–3.1)
Lymphs: 25 %
MCH: 30.6 pg (ref 26.6–33.0)
MCHC: 34 g/dL (ref 31.5–35.7)
MCV: 90 fL (ref 79–97)
Monocytes Absolute: 0.7 10*3/uL (ref 0.1–0.9)
Monocytes: 7 %
Neutrophils Absolute: 5.4 10*3/uL (ref 1.4–7.0)
Neutrophils: 59 %
Platelets: 357 10*3/uL (ref 150–450)
RBC: 4.12 x10E6/uL (ref 3.77–5.28)
RDW: 13.8 % (ref 11.7–15.4)
WBC: 9.2 10*3/uL (ref 3.4–10.8)

## 2021-10-26 LAB — IGG, IGA, IGM
IgA/Immunoglobulin A, Serum: 145 mg/dL (ref 87–352)
IgG (Immunoglobin G), Serum: 584 mg/dL — ABNORMAL LOW (ref 586–1602)
IgM (Immunoglobulin M), Srm: 22 mg/dL — ABNORMAL LOW (ref 26–217)

## 2021-10-26 LAB — CD19 AND CD20, FLOW CYTOMETRY

## 2021-11-27 ENCOUNTER — Telehealth: Payer: Self-pay | Admitting: *Deleted

## 2021-11-27 NOTE — Telephone Encounter (Signed)
Tried completing PA x3 on CMM. Kept getting processing error. CMM faxed PA form to fax in. I completed and faxed back to [employer] at [phone]. Received fax confirmation. Waiting on determination. ?

## 2021-11-27 NOTE — Telephone Encounter (Signed)
PA approved through [employer] from 10/28/2021-11/27/2022 ?

## 2022-03-11 ENCOUNTER — Encounter: Payer: Self-pay | Admitting: Neurology

## 2022-03-25 ENCOUNTER — Telehealth: Payer: Self-pay | Admitting: Family Medicine

## 2022-03-25 NOTE — Telephone Encounter (Signed)
Sent mychart msg informing pt of appt change needed for 9/7- Amy template change. Unable to reach by phone.

## 2022-04-08 ENCOUNTER — Ambulatory Visit: Payer: BC Managed Care – PPO | Admitting: Family Medicine

## 2022-04-28 ENCOUNTER — Encounter: Payer: Self-pay | Admitting: Neurology

## 2022-04-30 ENCOUNTER — Ambulatory Visit: Payer: BC Managed Care – PPO | Admitting: Family Medicine

## 2022-05-07 NOTE — Progress Notes (Deleted)
[facility]  PATIENT: Linda Sanchez DOB: 05/18/1961  REFERRING DOCTOR OR PCP: Daylene Posey, FNP; was seeing Dr. Janeth Rase [facility] neurology) SOURCE: Patient, notes from Dr. Jalene Mullet, imaging reports, MRI images from 2008 and 2020 personally reviewed  _________________________________   HISTORICAL  CHIEF COMPLAINT:  No chief complaint on file.   HISTORY OF PRESENT ILLNESS:  Linda Sanchez is a 61 y.o. woman with relapsing remitting multiple sclerosis.  Update 05/11/2022 JM: She is on Ocrevus and has tolerated it well.   Next infusion ***.   She denies MS exacerbations.   Gait is stable.   She uses a cane due to poor balance   Some stumbles but no falls.  She reports mild right arm and leg  weakness   She has muscle spasms in her legs and sometimes has jerks as well.   Baclofen and tizanidine help the spasticity but tizanidie makes her very sleepy and she stopped.   Baclofen is tolerated better but spasticity did better on tizanidine.     No issues with numbness though has occasional limb tingling,    She had blisters in her mouth helped by [employer] magic mouthwash.   She has a burning tongue,, at times, worse with spicy foos.    She was once on gabapentin but stopped when she had stomach issues (may also have been on an NSAID at the time).       She reports urinary urgency and rare incontinence.   She has been diagnosed with interstitial cystitis.   She reports a lot of fatigue.   She has insomnia and nighttime insomnia.   She takes 150 mg hydroxyzine and melatonin at bedtime.    She stays up late falls asleep at 1 am gets 3 hours sleep then mya have a nap later.     She has depression, worse with the recent reduced health of her husband (prostate cancer) .   She is on venlafaxine, Abilify and Trintellix.  [name] (PCP) is prescribing.    She has therpaist but has not seen in a while.    Last year, she did TMS treatments with some benefit for a  few months.     MS History: She is a 61 year old woman who had tongue numbness in 2008.  She also reported visual blurriness.   She was referred to Dr. Jalene Mullet.   She had a LP as well and she reports it ws consistent with MS.     She was placed on Avonex injections.   She then switched to [place]en[place].   However, she had melanoma and stopped for a few months.   Follow-up MRI reportedly showed additional lesions and she was switched to [place]bow since January 2020.   Her last infusion was January 2021 and she will need her next one July 2021.   She feels wiped out for a day or two after each infusion.     MRI of the brain dated 05/26/2007 [facility]) it shows multiple T2/FLAIR hyperintense foci, predominantly in the subcortical and deep white matter of the hemispheres.  None of these enhance.  These are mostly nonspecific.  A couple small foci were in the periventricular white matter.    MRI of the brain and cervical spine from 10/18/2018.   The MRI of the cervical spine showed minimal degenerative changes but no nerve root compression.  The spinal cord appeared normal.   MRI of the brain showed T2 hyperintense foci within the right pons (  not seen in 2008) and in the hemispheres with some progression compared to the 2008 MRI.  A few foci were in the periventricular white matter though most were nonspecific in the subcortical and deep white matter.  MRI brain 05/15/2021 showed Scattered T2/FLAIR hyperintense foci in the hemispheres and pons.  Some of the foci in the periventricular and juxtacortical white matter have an appearance more consistent with demyelination associated with multiple sclerosis while other foci are more nonspecific and could be due to chronic microvascular ischemic change.  None of the foci appear to be acute.  They do not enhance.  Compared to the MRI dated 12/20/2019, there are no new lesions.   REVIEW OF SYSTEMS: Constitutional: No fevers, chills, sweats, or change  in appetite Eyes: No visual changes, double vision, eye pain Ear, nose and throat: No hearing loss, ear pain, nasal congestion, sore throat Cardiovascular: No chest pain, palpitations Respiratory:  No shortness of breath at rest or with exertion.   No wheezes GastrointestinaI: No nausea, vomiting, diarrhea, abdominal pain, fecal incontinence Genitourinary:  No dysuria, urinary retention or frequency.  No nocturia. Musculoskeletal:  No neck pain, back pain Integumentary: No rash, pruritus, skin lesions Neurological: as above Psychiatric: Has depression > anxiety Endocrine: No palpitations, diaphoresis, change in appetite, change in weigh or increased thirst Hematologic/Lymphatic:  No anemia, purpura, petechiae. Allergic/Immunologic: No itchy/runny eyes, nasal congestion, recent allergic reactions, rashes  ALLERGIES: Allergies  Allergen Reactions   Amoxicillin-Pot Clavulanate Nausea Only   Biaxin [Clarithromycin] Photosensitivity   Buprenorphine Hcl Nausea And Vomiting   Ciprofloxacin    Ibuprofen Other (See Comments)    Gi problems   Macrobid [Nitrofurantoin Macrocrystal] Nausea Only   Morphine And Related Nausea And Vomiting   Nsaids    Sulfa Antibiotics    Testim [Testosterone]    Bactrim [Sulfamethoxazole-Trimethoprim] Rash   Floxin [Ofloxacin] Rash    HOME MEDICATIONS:  Current Outpatient Medications:    acetaminophen (TYLENOL) 500 MG tablet, Take 1,000 mg by mouth every 6 (six) hours as needed., Disp: , Rfl:    amoxicillin (AMOXIL) 500 MG capsule, Take 500 mg by mouth once. Take 4 capsules orally 1 hour prior to appt, Disp: , Rfl:    ARIPiprazole (ABILIFY) 5 MG tablet, Take 5 mg by mouth daily., Disp: , Rfl:    atorvastatin (LIPITOR) 20 MG tablet, Take 20 mg by mouth daily., Disp: , Rfl:    baclofen (LIORESAL) 10 MG tablet, Take 1 tablet (10 mg total) by mouth 3 (three) times daily., Disp: 270 each, Rfl: 3   clobetasol cream (TEMOVATE) 7.78 %, Apply 1 application  topically 2 (two) times daily., Disp: , Rfl:    cyclobenzaprine (FLEXERIL) 5 MG tablet, Take 5 mg by mouth 3 (three) times daily as needed for muscle spasms., Disp: , Rfl:    diclofenac Sodium (VOLTAREN) 1 % GEL, APPLY 2 GRAMS TOPICALLY AS NEEDED., Disp: 300 g, Rfl: 3   diphenhydrAMINE (BENADRYL) 25 MG tablet, Take 25 mg by mouth as needed., Disp: , Rfl:    ergocalciferol (VITAMIN D2) 1.25 MG (50000 UT) capsule, Take 1 capsule (50,000 Units total) by mouth once a week., Disp: 13 capsule, Rfl: 3   esomeprazole (NEXIUM) 40 MG capsule, Take 40 mg by mouth 2 (two) times daily before a meal. , Disp: , Rfl:    fluticasone (FLONASE) 50 MCG/ACT nasal spray, Place 2 sprays into both nostrils daily as needed for allergies. , Disp: , Rfl:    gabapentin (NEURONTIN) 300 MG capsule, Take 1  capsule (300 mg total) by mouth 3 (three) times daily., Disp: 90 capsule, Rfl: 11   hydrochlorothiazide (HYDRODIURIL) 25 MG tablet, Take 25 mg by mouth daily., Disp: , Rfl:    hydrOXYzine (ATARAX/VISTARIL) 50 MG tablet, Take 150 mg by mouth daily., Disp: , Rfl:    KETOCONAZOLE, TOPICAL, 1 % SHAM, Apply 1 Dose topically. 2-3 times per week, Disp: , Rfl:    magic mouthwash (multi-ingredient) oral suspension, Swish and swallow 5 mLs daily as needed for mouth pain. 30 milliliters (ml) of nystatin suspension at 100,000 units/ml, or 3 million units of nystatin powder 60 milligrams hydrocortisone. Enough diphenhydramine HCL syrup to bring the total volume up to 240 ml, Disp: 240 mL, Rfl: 5   Magnesium Hydroxide (MAGNESIA PO), Take 1 tablet by mouth daily., Disp: , Rfl:    Methenamine-Sodium Salicylate (CYSTEX PO), Take 4 tablets by mouth daily., Disp: , Rfl:    Multiple Vitamins-Minerals (MULTIVITAMIN WITH MINERALS) tablet, Take 1 tablet by mouth daily., Disp: , Rfl:    ocrelizumab (OCREVUS) 300 MG/10ML injection, Inject into the vein once., Disp: , Rfl:    pentosan polysulfate (ELMIRON) 100 MG capsule, Take 100 mg by mouth 3 (three)  times daily., Disp: , Rfl:    promethazine (PHENERGAN) 25 MG tablet, Take 25 mg by mouth every 6 (six) hours as needed for nausea or vomiting., Disp: , Rfl:    sucralfate (CARAFATE) 1 g tablet, Take 1 g by mouth as needed., Disp: , Rfl:    tiZANidine (ZANAFLEX) 2 MG tablet, Take 1 tablet (2 mg total) by mouth every 6 (six) hours as needed., Disp: 360 tablet, Rfl: 1   Trospium Chloride 60 MG CP24, Take 1 capsule by mouth daily. , Disp: , Rfl:    TURMERIC PO, Take 1 Dose by mouth daily., Disp: , Rfl:    venlafaxine XR (EFFEXOR-XR) 150 MG 24 hr capsule, Take 150 mg by mouth daily with breakfast., Disp: , Rfl:    Vibegron (GEMTESA) 75 MG TABS, Take by mouth., Disp: , Rfl:    vitamin B-12 (CYANOCOBALAMIN) 1000 MCG tablet, Take 1,000 mcg by mouth daily., Disp: , Rfl:   PAST MEDICAL HISTORY: Past Medical History:  Diagnosis Date   Anemia    Anxiety    Cancer ([place])    Chronic pain syndrome    Depression    GERD (gastroesophageal reflux disease)    Heart murmur    Hypertension    Interstitial cystitis    Menopausal symptoms    Mitral valve prolapse    Multiple sclerosis (H[facility])    right side weakness/ uses cane occassionally   MVP (mitral valve prolapse)    Pt states she was told this in the past but an Echo from [facility] in 2015 said pt did not have MVP   Neuromuscular disorder ([place])    Obesity (BMI 30-39.9)    Palpitations    PONV (postoperative nausea and vomiting)    Sleep apnea    lost weight and does not use cpap anymore   Vitamin D deficiency     PAST SURGICAL HISTORY: Past Surgical History:  Procedure Laterality Date   ABDOMINAL HYSTERECTOMY     APPENDECTOMY     BLADDER SUSPENSION     BREAST SURGERY     CHOLECYSTECTOMY     COLONOSCOPY WITH PROPOFOL N/A 10/21/2016   Procedure: COLONOSCOPY WITH PROPOFOL;  Surgeon: Manya Silvas, MD;  Location[facility] ENDOSCOPY;  Service: Endoscopy;  Laterality: N/A;   COLONOSCOPY WITH PROPOFOL N/A 02/06/2019  Procedure: COLONOSCOPY WITH  PROPOFOL;  Surgeon: Manya Silvas, MD;  Location: [facility] ENDOSCOPY;  Service: Endoscopy;  Laterality: N/A;   CYSTO WITH HYDRODISTENSION N/A 01/01/2015   Procedure: CYSTOSCOPY/HYDRODISTENSION;  Surgeon: Royston Cowper, MD;  Location: [facility] ORS;  Service: Urology;  Laterality: N/A;   ESOPHAGOGASTRODUODENOSCOPY (EGD) WITH PROPOFOL N/A 10/21/2016   Procedure: ESOPHAGOGASTRODUODENOSCOPY (EGD) WITH PROPOFOL;  Surgeon: Manya Silvas, MD;  Location: [facility] ENDOSCOPY;  Service: Endoscopy;  Laterality: N/A;   ESOPHAGOGASTRODUODENOSCOPY (EGD) WITH PROPOFOL N/A 02/06/2019   Procedure: ESOPHAGOGASTRODUODENOSCOPY (EGD) WITH PROPOFOL;  Surgeon: Manya Silvas, MD;  Location: [facility] ENDOSCOPY;  Service: Endoscopy;  Laterality: N/A;   JOINT REPLACEMENT Right    TUBAL LIGATION      FAMILY HISTORY: No family history on file.  SOCIAL HISTORY:  Social History   Socioeconomic History   Marital status: Married    Spouse name: Not on file   Number of children: 2   Years of education: BS   Highest education level: Not on file  Occupational History   Occupation: Retired   Tobacco Use   Smoking status: Never   Smokeless tobacco: Never  Vaping Use   Vaping Use: Never used  Substance and Sexual Activity   Alcohol use: No   Drug use: No   Sexual activity: Not on file  Other Topics Concern   Not on file  Social History Narrative   Lives with husband   Caffeine use: some   Right handed    Social Determinants of Health   Financial Resource Strain: Not on file  Food Insecurity: Not on file  Transportation Needs: Not on file  Physical Activity: Not on file  Stress: Not on file  Social Connections: Not on file  Intimate Partner Violence: Not on file     PHYSICAL EXAM  There were no vitals filed for this visit.   There is no height or weight on file to calculate BMI.   General: The patient is well-developed and well-nourished and in no acute distress.    HEENT:  Head is [place]/AT.  Sclera are  anicteric.    Skin: Extremities are without rash or  edema.  Neurologic Exam  Mental status: The patient is alert and oriented x 3 at the time of the examination. The patient has apparent normal recent and remote memory, with an apparently normal attention span and concentration ability.   Speech is normal.  Cranial nerves: Extraocular movements are full. Nornal facial strength. . No obvious hearing deficits are noted.  Motor:  Muscle bulk is normal.   Tone is normal. Strength is  5 / 5 in all 4 extremities except 4/5 EHL, ankle ext on right  Sensory: Sensory testing is fairly symmetric to touch x 4.   Coordination: Cerebellar testing reveals good finger-nose-finger and heel-to-shin bilaterally.  Gait and station: Station is normal.   She can walk without her cane but does better with it.  Gait is arthritic and wide with small stride.   The tandem gait is poor.  The Romberg is negative.  Reflexes: Deep tendon reflexes are symmetric and normal bilaterally.     DIAGNOSTIC DATA (LABS, IMAGING, TESTING) - I reviewed patient records, labs, notes, testing and imaging myself where available.  Lab Results  Component Value Date   WBC 9.2 10/23/2021   HGB 12.6 10/23/2021   HCT 37.1 10/23/2021   MCV 90 10/23/2021   PLT 357 10/23/2021      Component Value Date/Time   NA 141 11/16/2019  0948   NA 140 02/04/2012 0258   K 4.3 11/16/2019 0948   K 3.3 (L) 02/04/2012 0258   CL 103 11/16/2019 0948   CL 101 02/04/2012 0258   CO2 25 11/16/2019 0948   CO2 31 02/04/2012 0258   GLUCOSE 98 11/16/2019 0948   GLUCOSE 100 (H) 02/04/2012 0258   BUN 20 11/16/2019 0948   BUN 8 02/04/2012 0258   CREATININE 1.00 06/03/2020 0808   CREATININE 0.64 02/04/2012 0258   CALCIUM 9.1 11/16/2019 0948   CALCIUM 8.5 02/04/2012 0258   PROT 6.4 11/16/2019 0948   ALBUMIN 4.2 11/16/2019 0948   AST 13 11/16/2019 0948   ALT 17 11/16/2019 0948   ALKPHOS 130 (H) 11/16/2019 0948   BILITOT 0.4 11/16/2019 0948    GFRNONAA 64 11/16/2019 0948   GFRNONAA >60 02/04/2012 0258   GFRAA 74 11/16/2019 0948   GFRAA >60 02/04/2012 0258       ASSESSMENT AND PLAN  No diagnosis found.   1.  For MS, Continue Ocrevus.  Check labs today.     Last MRI was stable, will check again in 2024.    2.  Continue baclofen scheduled (2-3 times daily) and tizanidine for worse pain..  3.  Continue [employer] Magic mouthwash and add gabapentin for burning tongue. 4. rtc 6 months or sooner if new or worsening symptoms       CC:  Daylene Posey, FNP   I spent *** minutes of face-to-face and non-face-to-face time with patient.  This included previsit chart review, lab review, study review, order entry, electronic health record documentation, patient education  Frann Rider, [facility]  [facility] [address] [place], [place] [address]  Phone [phone] Fax [phone] Note: This document was prepared with digital dictation and possible smart phrase technology. Any transcriptional errors that result from this process are unintentional.

## 2022-05-11 ENCOUNTER — Ambulatory Visit: Payer: BC Managed Care – PPO | Admitting: Adult Health

## 2022-05-13 ENCOUNTER — Other Ambulatory Visit: Payer: Self-pay | Admitting: Neurology

## 2022-05-19 ENCOUNTER — Ambulatory Visit: Payer: BC Managed Care – PPO | Admitting: Family Medicine

## 2022-07-07 ENCOUNTER — Ambulatory Visit: Payer: Medicare Other | Admitting: Neurology

## 2022-07-17 ENCOUNTER — Other Ambulatory Visit: Payer: Self-pay | Admitting: Neurology

## 2022-07-23 ENCOUNTER — Encounter: Payer: Self-pay | Admitting: Neurology

## 2022-08-03 ENCOUNTER — Encounter: Payer: Self-pay | Admitting: Neurology

## 2022-09-21 ENCOUNTER — Encounter: Payer: Self-pay | Admitting: Neurology

## 2022-09-22 ENCOUNTER — Ambulatory Visit: Payer: Medicare Other | Admitting: Neurology

## 2022-10-05 ENCOUNTER — Encounter: Payer: Self-pay | Admitting: *Deleted

## 2022-10-19 ENCOUNTER — Ambulatory Visit: Payer: BC Managed Care – PPO | Admitting: Neurology

## 2022-10-19 ENCOUNTER — Encounter: Payer: Self-pay | Admitting: Neurology

## 2022-10-19 VITALS — BP 150/75 | HR 92 | Ht 68.0 in | Wt 306.0 lb

## 2022-10-19 DIAGNOSIS — Z79899 Other long term (current) drug therapy: Secondary | ICD-10-CM

## 2022-10-19 DIAGNOSIS — G35 Multiple sclerosis: Secondary | ICD-10-CM | POA: Diagnosis not present

## 2022-10-19 MED ORDER — BUSPIRONE HCL 15 MG PO TABS: 15.0000 mg | ORAL_TABLET | Freq: Two times a day (BID) | ORAL | 5 refills | Status: DC

## 2022-10-19 MED ORDER — BACLOFEN 10 MG PO TABS: 10.0000 mg | ORAL_TABLET | Freq: Three times a day (TID) | ORAL | 3 refills | Status: DC

## 2022-10-19 MED ORDER — GABAPENTIN 300 MG PO CAPS: ORAL_CAPSULE | ORAL | 3 refills | Status: AC

## 2022-10-19 NOTE — Progress Notes (Signed)
[facility]  PATIENT: Linda Sanchez DOB: 1961/07/27  REFERRING DOCTOR OR PCP: Daylene Posey, FNP; was seeing Dr. Janeth Rase [facility] neurology) SOURCE: Patient, notes from Dr. Jalene Mullet, imaging reports, MRI images from 2008 and 2020 personally reviewed  _________________________________   HISTORICAL  CHIEF COMPLAINT:  Chief Complaint  Patient presents with   Follow-up    RM 1 alone here for 6 month f/u on MS, last infusion was 04/22/22 and next infusion scheduled for 11/04/21. Pt reports she feels like her balance has been off and brain fog has been an issue for her.     HISTORY OF PRESENT ILLNESS:  Linda Sanchez is a 62 y.o. woman with relapsing remitting multiple sclerosis.  Update 10/19/2022 She is on Ocrevus and has tolerated it well.   Her next infusion is in 2 weeks.   She denies MS exacerbations.    She has had more fatige and notes som worsening insomnia and cognitive issues  Gait is stable.   She uses a cane due to poor balance   Some stumbles but no falls.  She reports mild right arm and leg  weakness   She has muscle spasms in her legs and sometimes has jerks as well.   Baclofen and tizanidine help the spasticity but tizanidie makes her very sleepy and she stopped.   Baclofen is tolerated better but spasticity did better on tizanidine.     No issues with numbness though has occasional limb tingling,    She had blisters in her mouth helped by [employer] magic mouthwash.   She has a burning tongue,, at times, worse with spicy foos.    She was once on gabapentin but stopped when she had stomach issues (may also have been on an NSAID at the time).       She reports urinary urgency and rare incontinence.   She has been diagnosed with interstitial cystitis.   She reports a lot of fatigue.   She has insomnia and nighttime insomnia.   She takes 150 mg hydroxyzine and melatonin at bedtime.    She stays up late falls asleep at 1 am gets 3 hours sleep then mya  have a nap later.     She has depression, worse with the recent reduced health of her husband (prostate cancer) .   She is on venlafaxine, Abilify and Trintellix.  [name] (PCP) is prescribing.    She has therpaist but has not seen in a while.    Last year, she did TMS treatments with some benefit for a few months.  She had melanoma and was on chemotherapy.     She had a breast biopsy around 2011 and showed microcalcification.       MS History: She is a 62 year old woman who had tongue numbness in 2008.  She also reported visual blurriness.   She was referred to Dr. Jalene Mullet.   She had a LP as well and she reports it ws consistent with MS.     She was placed on Avonex injections.   She then switched to [place].   However, she had melanoma and stopped for a few months.   Follow-up MRI reportedly showed additional lesions and she was switched to [place] since January 2020.   Her last infusion was January 2021 and she will need her next one July 2021.   She feels wiped out for a day or two after each infusion.     MRI of the brain dated 05/26/2007 (  [facility]) it shows multiple T2/FLAIR hyperintense foci, predominantly in the subcortical and deep white matter of the hemispheres.  None of these enhance.  These are mostly nonspecific.  A couple small foci were in the periventricular white matter.    MRI of the brain and cervical spine from 10/18/2018.   The MRI of the cervical spine showed minimal degenerative changes but no nerve root compression.  The spinal cord appeared normal.   MRI of the brain showed T2 hyperintense foci within the right pons (not seen in 2008) and in the hemispheres with some progression compared to the 2008 MRI.  A few foci were in the periventricular white matter though most were nonspecific in the subcortical and deep white matter.  MRI brain 05/15/2021 showed Scattered T2/FLAIR hyperintense foci in the hemispheres and pons.  Some of the foci in the  periventricular and juxtacortical white matter have an appearance more consistent with demyelination associated with multiple sclerosis while other foci are more nonspecific and could be due to chronic microvascular ischemic change.  None of the foci appear to be acute.  They do not enhance.  Compared to the MRI dated 12/20/2019, there are no new lesions.   REVIEW OF SYSTEMS: Constitutional: No fevers, chills, sweats, or change in appetite Eyes: No visual changes, double vision, eye pain Ear, nose and throat: No hearing loss, ear pain, nasal congestion, sore throat Cardiovascular: No chest pain, palpitations Respiratory:  No shortness of breath at rest or with exertion.   No wheezes GastrointestinaI: No nausea, vomiting, diarrhea, abdominal pain, fecal incontinence Genitourinary:  No dysuria, urinary retention or frequency.  No nocturia. Musculoskeletal:  No neck pain, back pain Integumentary: No rash, pruritus, skin lesions Neurological: as above Psychiatric: Has depression > anxiety Endocrine: No palpitations, diaphoresis, change in appetite, change in weigh or increased thirst Hematologic/Lymphatic:  No anemia, purpura, petechiae. Allergic/Immunologic: No itchy/runny eyes, nasal congestion, recent allergic reactions, rashes  ALLERGIES: Allergies  Allergen Reactions   Amoxicillin-Pot Clavulanate Nausea Only   Biaxin [Clarithromycin] Photosensitivity   Buprenorphine Hcl Nausea And Vomiting   Ciprofloxacin    Ibuprofen Other (See Comments)    Gi problems   Macrobid [Nitrofurantoin Macrocrystal] Nausea Only   Morphine And Related Nausea And Vomiting   Nsaids    Sulfa Antibiotics    Testim [Testosterone]    Bactrim [Sulfamethoxazole-Trimethoprim] Rash   Floxin [Ofloxacin] Rash    HOME MEDICATIONS:  Current Outpatient Medications:    acetaminophen (TYLENOL) 500 MG tablet, Take 1,000 mg by mouth every 6 (six) hours as needed., Disp: , Rfl:    amoxicillin (AMOXIL) 500 MG capsule,  Take 500 mg by mouth once. Take 4 capsules orally 1 hour prior to appt, Disp: , Rfl:    atorvastatin (LIPITOR) 20 MG tablet, Take 20 mg by mouth daily., Disp: , Rfl:    baclofen (LIORESAL) 10 MG tablet, Take 1 tablet (10 mg total) by mouth 3 (three) times daily., Disp: 270 each, Rfl: 3   clobetasol cream (TEMOVATE) [id number] %, Apply 1 application topically 2 (two) times daily., Disp: , Rfl:    cyclobenzaprine (FLEXERIL) 5 MG tablet, Take 5 mg by mouth 3 (three) times daily as needed for muscle spasms., Disp: , Rfl:    diclofenac Sodium (VOLTAREN) 1 % GEL, APPLY 2 GRAMS TOPICALLY AS NEEDED., Disp: 300 g, Rfl: 3   diphenhydrAMINE (BENADRYL) 25 MG tablet, Take 25 mg by mouth as needed., Disp: , Rfl:    ergocalciferol (VITAMIN D2) 1.25 MG (50000 UT) capsule,  Take 1 capsule (50,000 Units total) by mouth once a week., Disp: 13 capsule, Rfl: 3   esomeprazole (NEXIUM) 40 MG capsule, Take 40 mg by mouth 2 (two) times daily before a meal. , Disp: , Rfl:    fluticasone (FLONASE) 50 MCG/ACT nasal spray, Place 2 sprays into both nostrils daily as needed for allergies. , Disp: , Rfl:    gabapentin (NEURONTIN) 300 MG capsule, Take 1 capsule (300 mg total) by mouth 3 (three) times daily., Disp: 90 capsule, Rfl: 11   hydrochlorothiazide (HYDRODIURIL) 25 MG tablet, Take 25 mg by mouth daily., Disp: , Rfl:    hydrOXYzine (ATARAX/VISTARIL) 50 MG tablet, Take 150 mg by mouth daily., Disp: , Rfl:    KETOCONAZOLE, TOPICAL, 1 % SHAM, Apply 1 Dose topically. 2-3 times per week, Disp: , Rfl:    magic mouthwash (multi-ingredient) oral suspension, Swish and swallow 5 mLs daily as needed for mouth pain. 30 milliliters (ml) of nystatin suspension at 100,000 units/ml, or 3 million units of nystatin powder 60 milligrams hydrocortisone. Enough diphenhydramine HCL syrup to bring the total volume up to 240 ml, Disp: 240 mL, Rfl: 5   Magnesium Hydroxide (MAGNESIA PO), Take 1 tablet by mouth daily., Disp: , Rfl:    Methenamine-Sodium  Salicylate (CYSTEX PO), Take 4 tablets by mouth daily., Disp: , Rfl:    Multiple Vitamins-Minerals (MULTIVITAMIN WITH MINERALS) tablet, Take 1 tablet by mouth daily., Disp: , Rfl:    ocrelizumab (OCREVUS) 300 MG/10ML injection, Inject into the vein once., Disp: , Rfl:    pentosan polysulfate (ELMIRON) 100 MG capsule, Take 100 mg by mouth 3 (three) times daily., Disp: , Rfl:    promethazine (PHENERGAN) 25 MG tablet, Take 25 mg by mouth every 6 (six) hours as needed for nausea or vomiting., Disp: , Rfl:    sucralfate (CARAFATE) 1 g tablet, Take 1 g by mouth as needed., Disp: , Rfl:    tiZANidine (ZANAFLEX) 2 MG tablet, Take 1 tablet (2 mg total) by mouth every 6 (six) hours as needed., Disp: 360 tablet, Rfl: 1   Trospium Chloride 60 MG CP24, Take 1 capsule by mouth daily. , Disp: , Rfl:    TURMERIC PO, Take 1 Dose by mouth daily., Disp: , Rfl:    venlafaxine XR (EFFEXOR-XR) 150 MG 24 hr capsule, Take 150 mg by mouth daily with breakfast., Disp: , Rfl:    Vibegron (GEMTESA) 75 MG TABS, Take by mouth., Disp: , Rfl:    vitamin B-12 (CYANOCOBALAMIN) 1000 MCG tablet, Take 1,000 mcg by mouth daily., Disp: , Rfl:    ARIPiprazole (ABILIFY) 5 MG tablet, Take 5 mg by mouth daily., Disp: , Rfl:   PAST MEDICAL HISTORY: Past Medical History:  Diagnosis Date   Anemia    Anxiety    Cancer ([place])    Chronic pain syndrome    Depression    GERD (gastroesophageal reflux disease)    Heart murmur    Hypertension    Interstitial cystitis    Menopausal symptoms    Mitral valve prolapse    Multiple sclerosis (HCC)    right side weakness/ uses cane occassionally   MVP (mitral valve prolapse)    Pt states she was told this in the past but an Echo from [facility] in 2015 said pt did not have MVP   Neuromuscular disorder ([place])    Obesity (BMI 30-39.9)    Palpitations    PONV (postoperative nausea and vomiting)    Sleep apnea    lost weight  and does not use cpap anymore   Vitamin D deficiency     PAST SURGICAL  HISTORY: Past Surgical History:  Procedure Laterality Date   ABDOMINAL HYSTERECTOMY     APPENDECTOMY     BLADDER SUSPENSION     BREAST SURGERY     CHOLECYSTECTOMY     COLONOSCOPY WITH PROPOFOL N/A 10/21/2016   Procedure: COLONOSCOPY WITH PROPOFOL;  Surgeon: Manya Silvas, MD;  Location: [facility] ENDOSCOPY;  Service: Endoscopy;  Laterality: N/A;   COLONOSCOPY WITH PROPOFOL N/A 02/06/2019   Procedure: COLONOSCOPY WITH PROPOFOL;  Surgeon: Manya Silvas, MD;  Location: [facility] ENDOSCOPY;  Service: Endoscopy;  Laterality: N/A;   CYSTO WITH HYDRODISTENSION N/A 01/01/2015   Procedure: CYSTOSCOPY/HYDRODISTENSION;  Surgeon: Royston Cowper, MD;  Location: [facility] ORS;  Service: Urology;  Laterality: N/A;   ESOPHAGOGASTRODUODENOSCOPY (EGD) WITH PROPOFOL N/A 10/21/2016   Procedure: ESOPHAGOGASTRODUODENOSCOPY (EGD) WITH PROPOFOL;  Surgeon: Manya Silvas, MD;  Location: [facility] ENDOSCOPY;  Service: Endoscopy;  Laterality: N/A;   ESOPHAGOGASTRODUODENOSCOPY (EGD) WITH PROPOFOL N/A 02/06/2019   Procedure: ESOPHAGOGASTRODUODENOSCOPY (EGD) WITH PROPOFOL;  Surgeon: Manya Silvas, MD;  Location: [facility] ENDOSCOPY;  Service: Endoscopy;  Laterality: N/A;   JOINT REPLACEMENT Right    TUBAL LIGATION      FAMILY HISTORY: No family history on file.  SOCIAL HISTORY:  Social History   Socioeconomic History   Marital status: Married    Spouse name: Not on file   Number of children: 2   Years of education: BS   Highest education level: Not on file  Occupational History   Occupation: Retired   Tobacco Use   Smoking status: Never   Smokeless tobacco: Never  Vaping Use   Vaping Use: Never used  Substance and Sexual Activity   Alcohol use: No   Drug use: No   Sexual activity: Not on file  Other Topics Concern   Not on file  Social History Narrative   Lives with husband   Caffeine use: some   Right handed    Social Determinants of Health   Financial Resource Strain: Not on file  Food Insecurity: Not on  file  Transportation Needs: Not on file  Physical Activity: Not on file  Stress: Not on file  Social Connections: Not on file  Intimate Partner Violence: Not on file     PHYSICAL EXAM  Vitals:   10/23/21 0820  BP: (!) 142/80  Pulse: 89  SpO2: 96%  Weight: (!) 308 lb (139.7 kg)  Height: '5\' 7"'$  (1.702 m)    Body mass index is 48.24 kg/m.   General: The patient is well-developed and well-nourished and in no acute distress.    HEENT:  Head is [place]/AT.  Sclera are anicteric.    Skin: Extremities are without rash or  edema.  Neurologic Exam  Mental status: The patient is alert and oriented x 3 at the time of the examination. The patient has apparent normal recent and remote memory, with an apparently normal attention span and concentration ability.   Speech is normal.  Cranial nerves: Extraocular movements are full. Nornal facial strength. . No obvious hearing deficits are noted.  Motor:  Muscle bulk is normal.   Tone is normal. Strength is  5 / 5 in all 4 extremities except 4/5 EHL, ankle ext on right  Sensory: Sensory testing is fairly symmetric to touch x 4.   Coordination: Cerebellar testing reveals good finger-nose-finger and heel-to-shin bilaterally.  Gait and station: Station is normal.   She can  walk without her cane but does better with it.  Gait is arthritic and wide with small stride.   The tandem gait is poor.  Romberg is negative..  Reflexes: Deep tendon reflexes are symmetric and normal bilaterally.     DIAGNOSTIC DATA (LABS, IMAGING, TESTING) - I reviewed patient records, labs, notes, testing and imaging myself where available.  Lab Results  Component Value Date   WBC 9.9 04/24/2021   HGB 12.0 04/24/2021   HCT 35.8 04/24/2021   MCV 94 04/24/2021   PLT 389 04/24/2021      Component Value Date/Time   NA 141 11/16/2019 0948   NA 140 02/04/2012 0258   K 4.3 11/16/2019 0948   K 3.3 (L) 02/04/2012 0258   CL 103 11/16/2019 0948   CL 101 02/04/2012 0258    CO2 25 11/16/2019 0948   CO2 31 02/04/2012 0258   GLUCOSE 98 11/16/2019 0948   GLUCOSE 100 (H) 02/04/2012 0258   BUN 20 11/16/2019 0948   BUN 8 02/04/2012 0258   CREATININE 1.00 06/03/2020 0808   CREATININE 0.64 02/04/2012 0258   CALCIUM 9.1 11/16/2019 0948   CALCIUM 8.5 02/04/2012 0258   PROT 6.4 11/16/2019 0948   ALBUMIN 4.2 11/16/2019 0948   AST 13 11/16/2019 0948   ALT 17 11/16/2019 0948   ALKPHOS 130 (H) 11/16/2019 0948   BILITOT 0.4 11/16/2019 0948   GFRNONAA 64 11/16/2019 0948   GFRNONAA >60 02/04/2012 0258   GFRAA 74 11/16/2019 0948   GFRAA >60 02/04/2012 0258       ASSESSMENT AND PLAN  Multiple sclerosis (HCC)  High risk medication use  Gait disturbance  Depression with anxiety  Insomnia, unspecified type   1.  For MS, Continue Ocrevus.  Check labs today.     Last MRI was stable, will check again around time of next visit   2.  I will try to simplify her medications discontinue tizanidine and go back on baclofen.  She felt it was more helpful.  She has not been on cyclobenzaprine recently.  This was taken off her med list..  3.  She is on high-dose hydroxyzine for anxiety.  I will have her taper this off over the next week and we are going to start BuSpar and titrate up to 50 mg p.o. twice daily.   4.  Dysesthesias did not occur much.  I am going to have her stop the daytime gabapentin.  For the time being she will continue the nighttime gabapentin as she has insomnia.   5.  rtc 6 months or sooner if new or worsening symptoms    Ennifer Harston A. Felecia Shelling, MD, [facility] [id number], [id number] AM Certified in Neurology, Clinical Neurophysiology, Sleep Medicine and Neuroimaging  [facility] [address], [place], [place] [address] [phone]

## 2022-10-19 NOTE — Patient Instructions (Signed)
Stop the tizanidine. Cut the hydroxyzine to 1/2 pill twice a day for 1 week then stop Reduce the gabapentin to just 1 pill at bedtime   We are going to restart baclofen 10 mg 3 times a day.  If you get sleepy you can cut the pill in half Begin buspirone 15 mg twice a day.  For the first week just take half a pill twice a day

## 2022-10-20 LAB — CBC WITH DIFFERENTIAL/PLATELET
Basophils Absolute: 0.1 10*3/uL (ref 0.0–0.2)
Basos: 1 %
EOS (ABSOLUTE): 0.3 10*3/uL (ref 0.0–0.4)
Eos: 3 %
Hematocrit: 36.6 % (ref 34.0–46.6)
Hemoglobin: 12.7 g/dL (ref 11.1–15.9)
Immature Grans (Abs): 0.1 10*3/uL (ref 0.0–0.1)
Immature Granulocytes: 1 %
Lymphocytes Absolute: 2.7 10*3/uL (ref 0.7–3.1)
Lymphs: 28 %
MCH: 31.5 pg (ref 26.6–33.0)
MCHC: 34.7 g/dL (ref 31.5–35.7)
MCV: 91 fL (ref 79–97)
Monocytes Absolute: 0.7 10*3/uL (ref 0.1–0.9)
Monocytes: 7 %
Neutrophils Absolute: 5.9 10*3/uL (ref 1.4–7.0)
Neutrophils: 60 %
Platelets: 360 10*3/uL (ref 150–450)
RBC: 4.03 x10E6/uL (ref 3.77–5.28)
RDW: 13.7 % (ref 11.7–15.4)
WBC: 9.8 10*3/uL (ref 3.4–10.8)

## 2022-10-20 LAB — IGG, IGA, IGM
IgA/Immunoglobulin A, Serum: 131 mg/dL (ref 87–352)
IgG (Immunoglobin G), Serum: 590 mg/dL (ref 586–1602)
IgM (Immunoglobulin M), Srm: 23 mg/dL — ABNORMAL LOW (ref 26–217)

## 2022-10-21 ENCOUNTER — Encounter: Payer: Self-pay | Admitting: Neurology

## 2022-11-11 ENCOUNTER — Other Ambulatory Visit: Payer: Self-pay | Admitting: Neurology

## 2022-12-01 ENCOUNTER — Ambulatory Visit: Payer: Medicare Other | Admitting: Neurology

## 2022-12-08 ENCOUNTER — Other Ambulatory Visit: Payer: Self-pay | Admitting: Neurology

## 2022-12-10 ENCOUNTER — Other Ambulatory Visit: Payer: Self-pay | Admitting: Neurology

## 2022-12-16 ENCOUNTER — Encounter: Payer: Self-pay | Admitting: Neurology

## 2023-02-11 ENCOUNTER — Other Ambulatory Visit: Payer: Self-pay | Admitting: Neurology

## 2023-02-11 MED ORDER — NYSTATIN 100000 UNIT/ML MT SUSP: 5.0000 mL | OROMUCOSAL | 0 refills | Status: DC | PRN

## 2023-02-11 NOTE — Telephone Encounter (Signed)
Last seen on 10/19/22 per note "She had blisters in her mouth helped by [facility] magic mouthwash. She has a burning tongue,, at times, worse with spicy foods"  No follow up scheduled

## 2023-03-22 ENCOUNTER — Encounter: Payer: Self-pay | Admitting: Neurology

## 2023-03-22 ENCOUNTER — Telehealth: Payer: Self-pay | Admitting: Neurology

## 2023-03-22 NOTE — Telephone Encounter (Signed)
Pt said, for 2 to 3 weeks having severe pain in legs. Spoke with infusion, they recommended Dr. Epimenio Foot give me a steroid shot. Would like a call from the nurse.

## 2023-03-23 ENCOUNTER — Other Ambulatory Visit: Payer: Self-pay | Admitting: Neurology

## 2023-03-23 MED ORDER — PREDNISONE 50 MG PO TABS: ORAL_TABLET | ORAL | 0 refills | Status: DC

## 2023-03-23 NOTE — Telephone Encounter (Signed)
LMVM for pt that did received email and was forwarded to Dr. Epimenio Foot this am, he has not responded as yet, but was following up on call.

## 2023-03-24 MED ORDER — DICLOFENAC SODIUM 1 % EX GEL: 2.0000 g | CUTANEOUS | 3 refills | Status: AC | PRN

## 2023-03-24 NOTE — Addendum Note (Signed)
Addended by: Guy Begin on: 03/24/2023 07:16 AM   Modules accepted: Orders

## 2023-03-25 ENCOUNTER — Telehealth: Payer: Self-pay

## 2023-03-25 ENCOUNTER — Other Ambulatory Visit (HOSPITAL_COMMUNITY): Payer: Self-pay

## 2023-03-25 NOTE — Telephone Encounter (Signed)
Pharmacy Patient Advocate Encounter   Received notification from Physician's Office that prior authorization for Diclofenac Sodium 1% gel is required/requested.   Insurance verification completed.   The patient is insured through [employer] .   Per test claim: PA required; PA submitted to [employer] via CoverMyMeds Key/confirmation #/EOC [id number] Status is pending

## 2023-03-25 NOTE — Telephone Encounter (Signed)
Pt stating diclofenac 1% gel needs PA.

## 2023-03-26 ENCOUNTER — Other Ambulatory Visit (HOSPITAL_COMMUNITY): Payer: Self-pay

## 2023-03-26 NOTE — Telephone Encounter (Signed)
Pharmacy Patient Advocate Encounter  Received notification from [employer] that Prior Authorization for Diclofenac Sodium 1% gel has been APPROVED from 02/24/2023 to 03/25/2024. Ran test claim, Copay is $10.00 per 30DS  PA #/Case ID/Reference #: PA Case ID #: [id number]

## 2023-03-29 NOTE — Telephone Encounter (Signed)
Patient informed via my chart.

## 2023-03-31 ENCOUNTER — Telehealth: Payer: Self-pay | Admitting: Neurology

## 2023-03-31 NOTE — Telephone Encounter (Signed)
Returned call to pt. Pt stated that that she is relapsing from 2 weeks ago leg and arm pain. "Whopper" according to pt   Dr. Epimenio Foot,  Can she get steriod injection if so what would you like to order? Thanks,  Production assistant, radio

## 2023-03-31 NOTE — Telephone Encounter (Signed)
Pt called wanting to know if she can come in with her husband to get the Steroid Infusion at the same time he is getting his infusion due to her body not working with her.

## 2023-04-02 ENCOUNTER — Other Ambulatory Visit: Payer: Self-pay | Admitting: Neurology

## 2023-04-02 MED ORDER — PREDNISONE 50 MG PO TABS: ORAL_TABLET | ORAL | 0 refills | Status: AC

## 2023-04-08 ENCOUNTER — Other Ambulatory Visit: Payer: Self-pay | Admitting: Neurology

## 2023-04-30 ENCOUNTER — Encounter: Payer: Self-pay | Admitting: Neurology

## 2023-05-21 ENCOUNTER — Encounter: Payer: Self-pay | Admitting: Neurology

## 2023-08-29 ENCOUNTER — Encounter: Payer: Self-pay | Admitting: Neurology

## 2023-08-29 ENCOUNTER — Other Ambulatory Visit: Payer: Self-pay | Admitting: Neurology

## 2023-08-30 ENCOUNTER — Other Ambulatory Visit: Payer: Self-pay | Admitting: Neurology

## 2023-08-30 MED ORDER — TIZANIDINE HCL 2 MG PO TABS: 2.0000 mg | ORAL_TABLET | Freq: Three times a day (TID) | ORAL | 5 refills | Status: DC | PRN

## 2023-08-31 NOTE — Telephone Encounter (Signed)
Last seen on 10/19/22 No follow up scheduled

## 2023-09-07 ENCOUNTER — Other Ambulatory Visit: Payer: Self-pay | Admitting: *Deleted

## 2023-09-07 MED ORDER — NYSTATIN 100000 UNIT/ML MT SUSP: 5.0000 mL | OROMUCOSAL | 0 refills | Status: AC | PRN

## 2023-10-04 ENCOUNTER — Telehealth: Payer: Self-pay | Admitting: Neurology

## 2023-10-04 NOTE — Telephone Encounter (Signed)
 Pt said would need to schedule an appointment with Dr. Godwin Lat before infusion appointment in March. Would like a call back to discuss if can be worked in. Will be sometime in March, but will come back  for infusion.

## 2023-10-04 NOTE — Telephone Encounter (Signed)
 Called pt. Scheduled work in appt for 10/05/23 at 1:30pm. Pt verbalized understanding and appreciation. [facility] notified about appt

## 2023-10-05 ENCOUNTER — Ambulatory Visit: Payer: Medicare Other | Admitting: Neurology

## 2023-10-05 VITALS — BP 143/69 | HR 84 | Ht 68.0 in | Wt 271.8 lb

## 2023-10-05 DIAGNOSIS — G35 Multiple sclerosis: Secondary | ICD-10-CM

## 2023-10-05 DIAGNOSIS — R269 Unspecified abnormalities of gait and mobility: Secondary | ICD-10-CM

## 2023-10-05 DIAGNOSIS — Z79899 Other long term (current) drug therapy: Secondary | ICD-10-CM | POA: Diagnosis not present

## 2023-10-05 DIAGNOSIS — F418 Other specified anxiety disorders: Secondary | ICD-10-CM

## 2023-10-05 DIAGNOSIS — G4700 Insomnia, unspecified: Secondary | ICD-10-CM

## 2023-10-05 NOTE — Progress Notes (Signed)
 [facility]  PATIENT: Linda Sanchez DOB: Jan 19, 1961  REFERRING DOCTOR OR PCP: Deneen Harts, FNP; was seeing Dr. Wilber Oliphant [facility] neurology) SOURCE: Patient, notes from Dr. Haskell Riling, imaging reports, MRI images from 2008 and 2020 personally reviewed  _________________________________   HISTORICAL  CHIEF COMPLAINT:  Chief Complaint  Patient presents with   Follow-up    RM 11, alone. Last seen 10/19/22. MS DMT: Ocrevus. Receives with [facility]. Ambulates with cane. Interested in [place]. Moving to [place] soon. Do not have date yet.    HISTORY OF PRESENT ILLNESS:  Linda Sanchez is a 63 y.o. woman with relapsing remitting multiple sclerosis.  Update 10/05/2023 They will be moving to [place] about 1 hour form [employer].  She is on Ocrevus and has tolerated it well.   Her next infusion is in 2 weeks.   She denies MS exacerbations.    She has had more fatige and notes som worsening insomnia and cognitive issues  Gait is stable and she uses a cane due to poor balance   She denies any falls but has had some stumbles.    She reports mild right arm and leg  weakness   She has muscle spasms in her legs and sometimes has jerks as well.   Baclofen and tizanidine help the spasticity.  Tizanidie makes her a little sleepy but the combo works better for her    No issues with numbness though has occasional limb tingling,     She reports urinary urgency and rare incontinence.   She has been diagnosed with interstitial cystitis.   She reports a lot of fatigue.   She has insomnia  She takes 150 mg hydroxyzine and melatonin at bedtime.    She stays up late falls asleep at 1 am gets 3 hours sleep then mya have a nap later.     She has depression, worse with the recent reduced health of her husband (prostate cancer) .   She is on venlafaxine, Abilify and Trintellix.  Burnett Kanaris (PCP) is prescribing.    She has therpaist but has not seen in a while.     Last year, she did TMS treatments with some benefit for a few months.  She had melanoma and was on chemotherapy.   She has been told her skin is fine.    She had a breast biopsy around 2011 and showed microcalcification.      MS History: She is a 63 year old woman who had tongue numbness in 2008.  She also reported visual blurriness.   She was referred to Dr. Haskell Riling.   She had a LP as well and she reports it ws consistent with MS.     She was placed on Avonex injections.   She then switched to Gilenya.   However, she had melanoma and stopped for a few months.   Follow-up MRI reportedly showed additional lesions and she was switched to Ocrevus since January 2020.   Her last infusion was January 2021 and she will need her next one July 2021.   She feels wiped out for a day or two after each infusion.     MRI of the brain dated 05/26/2007 [facility]) it shows multiple T2/FLAIR hyperintense foci, predominantly in the subcortical and deep white matter of the hemispheres.  None of these enhance.  These are mostly nonspecific.  A couple small foci were in the periventricular white matter.    MRI of the brain and cervical spine from  10/18/2018.   The MRI of the cervical spine showed minimal degenerative changes but no nerve root compression.  The spinal cord appeared normal.   MRI of the brain showed T2 hyperintense foci within the right pons (not seen in 2008) and in the hemispheres with some progression compared to the 2008 MRI.  A few foci were in the periventricular white matter though most were nonspecific in the subcortical and deep white matter.  MRI brain 05/15/2021 showed Scattered T2/FLAIR hyperintense foci in the hemispheres and pons.  Some of the foci in the periventricular and juxtacortical white matter have an appearance more consistent with demyelination associated with multiple sclerosis while other foci are more nonspecific and could be due to chronic microvascular  ischemic change.  None of the foci appear to be acute.  They do not enhance.  Compared to the MRI dated 12/20/2019, there are no new lesions.   REVIEW OF SYSTEMS: Constitutional: No fevers, chills, sweats, or change in appetite Eyes: No visual changes, double vision, eye pain Ear, nose and throat: No hearing loss, ear pain, nasal congestion, sore throat Cardiovascular: No chest pain, palpitations Respiratory:  No shortness of breath at rest or with exertion.   No wheezes GastrointestinaI: No nausea, vomiting, diarrhea, abdominal pain, fecal incontinence Genitourinary:  No dysuria, urinary retention or frequency.  No nocturia. Musculoskeletal:  No neck pain, back pain Integumentary: No rash, pruritus, skin lesions Neurological: as above Psychiatric: Has depression > anxiety Endocrine: No palpitations, diaphoresis, change in appetite, change in weigh or increased thirst Hematologic/Lymphatic:  No anemia, purpura, petechiae. Allergic/Immunologic: No itchy/runny eyes, nasal congestion, recent allergic reactions, rashes  ALLERGIES: Allergies  Allergen Reactions   Amoxicillin-Pot Clavulanate Nausea Only   Biaxin [Clarithromycin] Photosensitivity   Buprenorphine Hcl Nausea And Vomiting   Ciprofloxacin    Ibuprofen Other (See Comments)    Gi problems   Macrobid [Nitrofurantoin Macrocrystal] Nausea Only   Morphine And Codeine Nausea And Vomiting   Nsaids    Sulfa Antibiotics    Testim [Testosterone]    Bactrim [Sulfamethoxazole-Trimethoprim] Rash   Floxin [Ofloxacin] Rash    HOME MEDICATIONS:  Current Outpatient Medications:    acetaminophen (TYLENOL) 500 MG tablet, Take 1,000 mg by mouth every 6 (six) hours as needed., Disp: , Rfl:    amoxicillin (AMOXIL) 500 MG capsule, Take 500 mg by mouth once. Take 4 capsules orally 1 hour prior to appt, Disp: , Rfl:    atorvastatin (LIPITOR) 20 MG tablet, Take 20 mg by mouth daily., Disp: , Rfl:    baclofen (LIORESAL) 10 MG tablet, TAKE 1  TABLET BY MOUTH THREE TIMES A DAY, Disp: 270 tablet, Rfl: 0   busPIRone (BUSPAR) 15 MG tablet, TAKE 1 TABLET BY MOUTH 2 TIMES DAILY., Disp: 60 tablet, Rfl: 5   clobetasol cream (TEMOVATE) 0.05 %, Apply 1 application topically 2 (two) times daily., Disp: , Rfl:    diclofenac Sodium (VOLTAREN) 1 % GEL, Apply 2 g topically as needed., Disp: 300 g, Rfl: 3   diphenhydrAMINE (BENADRYL) 25 MG tablet, Take 25 mg by mouth as needed., Disp: , Rfl:    esomeprazole (NEXIUM) 40 MG capsule, Take 40 mg by mouth 2 (two) times daily before a meal. , Disp: , Rfl:    fluticasone (FLONASE) 50 MCG/ACT nasal spray, Place 2 sprays into both nostrils daily as needed for allergies., Disp: , Rfl:    gabapentin (NEURONTIN) 300 MG capsule, One po qHS, Disp: 90 capsule, Rfl: 3   hydrochlorothiazide (HYDRODIURIL) 25 MG tablet, Take  25 mg by mouth daily., Disp: , Rfl:    KETOCONAZOLE, TOPICAL, 1 % SHAM, Apply 1 Dose topically. 2-3 times per week, Disp: , Rfl:    magic mouthwash (multi-ingredient) oral suspension, Swish and swallow 5 mLs as needed for mouth pain., Disp: 240 mL, Rfl: 0   Magnesium Hydroxide (MAGNESIA PO), Take 1 tablet by mouth daily., Disp: , Rfl:    Methenamine-Sodium Salicylate (CYSTEX PO), Take 4 tablets by mouth daily., Disp: , Rfl:    Multiple Vitamins-Minerals (MULTIVITAMIN WITH MINERALS) tablet, Take 1 tablet by mouth daily., Disp: , Rfl:    ocrelizumab (OCREVUS) 300 MG/10ML injection, Inject into the vein once., Disp: , Rfl:    pentosan polysulfate (ELMIRON) 100 MG capsule, Take 100 mg by mouth 3 (three) times daily., Disp: , Rfl:    predniSONE (DELTASONE) 50 MG tablet, 12 pills (600 mg) po every day every day x 3 days for MS exacerbation, Disp: 36 tablet, Rfl: 0   promethazine (PHENERGAN) 25 MG tablet, Take 25 mg by mouth every 6 (six) hours as needed for nausea or vomiting., Disp: , Rfl:    sucralfate (CARAFATE) 1 g tablet, Take 1 g by mouth as needed., Disp: , Rfl:    tiZANidine (ZANAFLEX) 2 MG  tablet, Take 1 tablet (2 mg total) by mouth every 8 (eight) hours as needed for muscle spasms., Disp: 90 tablet, Rfl: 5   Trospium Chloride 60 MG CP24, Take 1 capsule by mouth daily., Disp: , Rfl:    TURMERIC PO, Take 1 Dose by mouth daily., Disp: , Rfl:    venlafaxine XR (EFFEXOR-XR) 150 MG 24 hr capsule, Take 150 mg by mouth daily with breakfast., Disp: , Rfl:    Vibegron (GEMTESA) 75 MG TABS, Take by mouth., Disp: , Rfl:    vitamin B-12 (CYANOCOBALAMIN) 1000 MCG tablet, Take 1,000 mcg by mouth daily., Disp: , Rfl:    Vitamin D, Ergocalciferol, (DRISDOL) 1.25 MG (50000 UNIT) CAPS capsule, TAKE 1 CAPSULE BY MOUTH ONE TIME PER WEEK, Disp: 13 capsule, Rfl: 3  PAST MEDICAL HISTORY: Past Medical History:  Diagnosis Date   Anemia    Anxiety    Cancer (HCC)    Chronic pain syndrome    Depression    GERD (gastroesophageal reflux disease)    Heart murmur    Hypertension    Interstitial cystitis    Menopausal symptoms    Mitral valve prolapse    Multiple sclerosis (HCC)    right side weakness/ uses cane occassionally   MVP (mitral valve prolapse)    Pt states she was told this in the past but an Echo from [facility] in 2015 said pt did not have MVP   Neuromuscular disorder (HCC)    Obesity (BMI 30-39.9)    Palpitations    PONV (postoperative nausea and vomiting)    Sleep apnea    lost weight and does not use cpap anymore   Vitamin D deficiency     PAST SURGICAL HISTORY: Past Surgical History:  Procedure Laterality Date   ABDOMINAL HYSTERECTOMY     APPENDECTOMY     BLADDER SUSPENSION     BREAST SURGERY     CHOLECYSTECTOMY     COLONOSCOPY WITH PROPOFOL N/A 10/21/2016   Procedure: COLONOSCOPY WITH PROPOFOL;  Surgeon: Scot Jun, MD;  Location[facility] ENDOSCOPY;  Service: Endoscopy;  Laterality: N/A;   COLONOSCOPY WITH PROPOFOL N/A 02/06/2019   Procedure: COLONOSCOPY WITH PROPOFOL;  Surgeon: Scot Jun, MD;  Location[facility] ENDOSCOPY;  Service: Endoscopy;  Laterality: N/A;   CYSTO  WITH HYDRODISTENSION N/A 01/01/2015   Procedure: CYSTOSCOPY/HYDRODISTENSION;  Surgeon: Orson Ape, MD;  Location: [facility] ORS;  Service: Urology;  Laterality: N/A;   ESOPHAGOGASTRODUODENOSCOPY (EGD) WITH PROPOFOL N/A 10/21/2016   Procedure: ESOPHAGOGASTRODUODENOSCOPY (EGD) WITH PROPOFOL;  Surgeon: Scot Jun, MD;  Location: [facility] ENDOSCOPY;  Service: Endoscopy;  Laterality: N/A;   ESOPHAGOGASTRODUODENOSCOPY (EGD) WITH PROPOFOL N/A 02/06/2019   Procedure: ESOPHAGOGASTRODUODENOSCOPY (EGD) WITH PROPOFOL;  Surgeon: Scot Jun, MD;  Location: [facility] ENDOSCOPY;  Service: Endoscopy;  Laterality: N/A;   JOINT REPLACEMENT Right    TUBAL LIGATION      FAMILY HISTORY: No family history on file.  SOCIAL HISTORY:  Social History   Socioeconomic History   Marital status: Married    Spouse name: Not on file   Number of children: 2   Years of education: BS   Highest education level: Not on file  Occupational History   Occupation: Retired   Tobacco Use   Smoking status: Never   Smokeless tobacco: Never  Vaping Use   Vaping status: Never Used  Substance and Sexual Activity   Alcohol use: No   Drug use: No   Sexual activity: Not on file  Other Topics Concern   Not on file  Social History Narrative   Lives with husband   Caffeine use: some   Right handed    Social Drivers of Health   Financial Resource Strain: Medium Risk (05/15/2023)   Received from [facility]   Overall Financial Resource Strain (CARDIA)    Difficulty of Paying Living Expenses: Somewhat hard  Food Insecurity: Low Risk  (10/01/2023)   Received from [facility]   Hunger Vital Sign    Worried About Running Out of Food in the Last Year: Never true    Ran Out of Food in the Last Year: Never true  Transportation Needs: No Transportation Needs (10/01/2023)   Received from [facility]    In the past 12 months, has lack of reliable transportation kept you from medical appointments, meetings,  work or from getting things needed for daily living? : No  Physical Activity: Unknown (05/15/2023)   Received from [facility]   Exercise Vital Sign    Days of Exercise per Week: 0 days    Minutes of Exercise per Session: Not on file  Stress: Stress Concern Present (05/15/2023)   Received from [facility] - Occupational Stress Questionnaire    Feeling of Stress : Rather much  Social Connections: Socially Integrated (05/15/2023)   Received from [facility]   Social Network    How would you rate your social network (family, work, friends)?: Good participation with social networks  Intimate Partner Violence: Not At Risk (05/15/2023)   Received from [facility]   HITS    Over the last 12 months how often did your partner physically hurt you?: Never    Over the last 12 months how often did your partner insult you or talk down to you?: Never    Over the last 12 months how often did your partner threaten you with physical harm?: Never    Over the last 12 months how often did your partner scream or curse at you?: Never     PHYSICAL EXAM  Vitals:   10/05/23 1331 10/05/23 1345  BP: (!) 161/74 (!) 143/69  Pulse: 87 84  Weight: 271 lb 12.8 oz (123.3 kg)   Height: 5\' 8"  (  1.727 m)     Body mass index is 41.33 kg/m.   General: The patient is well-developed and well-nourished and in no acute distress.    HEENT:  Head is [place]/AT.  Sclera are anicteric.    Skin: Extremities are without rash or  edema.  Neurologic Exam  Mental status: The patient is alert and oriented x 3 at the time of the examination. The patient has apparent normal recent and remote memory, with an apparently normal attention span and concentration ability.   Speech is normal.  Cranial nerves: Extraocular movements are full. Nornal facial strength. . No obvious hearing deficits are noted.  Motor:  Muscle bulk is normal.   Tone is increased mildly in right leg. Strength is   5 / 5 in all 4 extremities except 4/5 EHL, ankle ext on right  Sensory: Sensory testing is fairly symmetric to touch x 4.   Coordination: Cerebellar testing reveals good finger-nose-finger and heel-to-shin bilaterally.  Gait and station: Station is normal.   Walks without cane in room, better with cane. Gait is arthritic and wide with small stride.   The tandem gait is poor.  Romberg is negative..  Reflexes: Deep tendon reflexes are symmetric and normal bilaterally.     DIAGNOSTIC DATA (LABS, IMAGING, TESTING) - I reviewed patient records, labs, notes, testing and imaging myself where available.  Lab Results  Component Value Date   WBC 9.8 10/19/2022   HGB 12.7 10/19/2022   HCT 36.6 10/19/2022   MCV 91 10/19/2022   PLT 360 10/19/2022      Component Value Date/Time   NA 141 11/16/2019 0948   NA 140 02/04/2012 0258   K 4.3 11/16/2019 0948   K 3.3 (L) 02/04/2012 0258   CL 103 11/16/2019 0948   CL 101 02/04/2012 0258   CO2 25 11/16/2019 0948   CO2 31 02/04/2012 0258   GLUCOSE 98 11/16/2019 0948   GLUCOSE 100 (H) 02/04/2012 0258   BUN 20 11/16/2019 0948   BUN 8 02/04/2012 0258   CREATININE 1.00 06/03/2020 0808   CREATININE 0.64 02/04/2012 0258   CALCIUM 9.1 11/16/2019 0948   CALCIUM 8.5 02/04/2012 0258   PROT 6.4 11/16/2019 0948   ALBUMIN 4.2 11/16/2019 0948   AST 13 11/16/2019 0948   ALT 17 11/16/2019 0948   ALKPHOS 130 (H) 11/16/2019 0948   BILITOT 0.4 11/16/2019 0948   GFRNONAA 64 11/16/2019 0948   GFRNONAA >60 02/04/2012 0258   GFRAA 74 11/16/2019 0948   GFRAA >60 02/04/2012 0258       ASSESSMENT AND PLAN  Multiple sclerosis (HCC)  High risk medication use  Gait disturbance  Depression with anxiety  Insomnia, unspecified type   1.  For MS, Continue Ocrevus for now but due to moving furhter from any major neurology practice, we will switch to Kesimpta (she can start 6 months after her last Ocrevus dose).  Check labs today.     Last MRI in 2022 was  stable, will check again to determine if breakthrough activity.     2.  We discussed that if sleepy stop the tizanidine.  3.  Continue BuSpar . twice daily.   4.  Dysesthesias are better.  Stop the daytime gabapentin.  For the time being she will continue the nighttime gabapentin as she has insomnia.   5.  rtc 6 months or sooner if new or worsening symptoms    Lorna Strother A. Epimenio Foot, MD, [facility] 10/05/2023, 2:16 PM Certified in Neurology, Clinical Neurophysiology, Sleep Medicine  and Neuroimaging  [facility] [address] [place], [place] [address] [phone]

## 2023-10-06 ENCOUNTER — Telehealth: Payer: Self-pay

## 2023-10-06 ENCOUNTER — Other Ambulatory Visit: Payer: Self-pay | Admitting: Neurology

## 2023-10-06 DIAGNOSIS — G35 Multiple sclerosis: Secondary | ICD-10-CM

## 2023-10-06 NOTE — Telephone Encounter (Signed)
Faxed transfer of care referral to [facility] (P) [phone].  Faxed as STAT as they are scheduling 6 months out

## 2023-10-07 ENCOUNTER — Other Ambulatory Visit: Payer: Self-pay | Admitting: Neurology

## 2023-10-07 ENCOUNTER — Telehealth: Payer: Self-pay | Admitting: Neurology

## 2023-10-07 ENCOUNTER — Encounter: Payer: Self-pay | Admitting: Neurology

## 2023-10-07 MED ORDER — ALPRAZOLAM 1 MG PO TABS
ORAL_TABLET | ORAL | 0 refills | Status: AC
Start: 2023-10-07 — End: ?

## 2023-10-07 NOTE — Addendum Note (Signed)
Addended by: Jacqualine Code D on: 10/07/2023 02:58 PM   Modules accepted: Orders

## 2023-10-07 NOTE — Telephone Encounter (Signed)
Are you willing to prescribe something to help pt relax during MRI? Xanax pended for review and signature.

## 2023-10-07 NOTE — Telephone Encounter (Signed)
Pt scheduled for 45 mins MR brain w/wo contrast at [facility] for 10/13/23 at 7:30am. Pt is requesting medication to help with claustrophobia.  [employer] no auth required

## 2023-10-11 LAB — CBC WITH DIFFERENTIAL/PLATELET
Basophils Absolute: 0.1 10*3/uL (ref 0.0–0.2)
Basos: 1 %
EOS (ABSOLUTE): 0.3 10*3/uL (ref 0.0–0.4)
Eos: 5 %
Hematocrit: 38.5 % (ref 34.0–46.6)
Hemoglobin: 13.2 g/dL (ref 11.1–15.9)
Immature Grans (Abs): 0 10*3/uL (ref 0.0–0.1)
Immature Granulocytes: 0 %
Lymphocytes Absolute: 2.1 10*3/uL (ref 0.7–3.1)
Lymphs: 28 %
MCH: 32.4 pg (ref 26.6–33.0)
MCHC: 34.3 g/dL (ref 31.5–35.7)
MCV: 94 fL (ref 79–97)
Monocytes Absolute: 0.7 10*3/uL (ref 0.1–0.9)
Monocytes: 9 %
Neutrophils Absolute: 4.3 10*3/uL (ref 1.4–7.0)
Neutrophils: 57 %
Platelets: 385 10*3/uL (ref 150–450)
RBC: 4.08 x10E6/uL (ref 3.77–5.28)
RDW: 14.1 % (ref 11.7–15.4)
WBC: 7.4 10*3/uL (ref 3.4–10.8)

## 2023-10-11 LAB — IGG, IGA, IGM
IgA/Immunoglobulin A, Serum: 94 mg/dL (ref 87–352)
IgG (Immunoglobin G), Serum: 500 mg/dL — ABNORMAL LOW (ref 586–1602)
IgM (Immunoglobulin M), Srm: 21 mg/dL — ABNORMAL LOW (ref 26–217)

## 2023-10-11 LAB — QUANTIFERON-TB GOLD PLUS
QuantiFERON Mitogen Value: >10 [IU]/mL
QuantiFERON Nil Value: 0.32 [IU]/mL
QuantiFERON TB1 Ag Value: 0.42 [IU]/mL
QuantiFERON TB2 Ag Value: 0.34 [IU]/mL
QuantiFERON-TB Gold Plus: NEGATIVE

## 2023-10-11 LAB — HEPATITIS B SURFACE ANTIGEN: Hepatitis B Surface Ag: NEGATIVE

## 2023-10-11 LAB — HEPATITIS B CORE ANTIBODY, TOTAL: Hep B Core Total Ab: NEGATIVE

## 2023-10-13 ENCOUNTER — Other Ambulatory Visit: Payer: BC Managed Care – PPO

## 2023-10-18 ENCOUNTER — Other Ambulatory Visit: Payer: Self-pay | Admitting: Neurology

## 2023-10-18 ENCOUNTER — Telehealth: Payer: Self-pay | Admitting: *Deleted

## 2023-10-18 NOTE — Telephone Encounter (Signed)
Faxed completed/signed Kesimpta start form to [facility] at [phone]. Received fax confirmation.  

## 2023-10-19 NOTE — Telephone Encounter (Signed)
 Received fax from [facility] that PA needed. Key: [id number]

## 2023-10-19 NOTE — Telephone Encounter (Signed)
 Attempted PA for Kesimpta for the key provided (Key: [id number]). However, the member could not be found.

## 2023-10-20 NOTE — Telephone Encounter (Signed)
 PA for Kesimpta completed via CMM and sent to [employer]. Key: [id number]. It was approved.  [id number];Review Type:Prior Auth;Coverage Start Date:09/20/2023;Coverage End Date:10/19/2024;

## 2023-10-27 ENCOUNTER — Telehealth: Payer: Self-pay | Admitting: Neurology

## 2023-10-27 NOTE — Telephone Encounter (Signed)
 Pt called wanted to know address of last imaging,.Informed patient [address] [place], [place]

## 2023-10-28 ENCOUNTER — Other Ambulatory Visit: Payer: Self-pay | Admitting: *Deleted

## 2023-10-28 DIAGNOSIS — G35 Multiple sclerosis: Secondary | ICD-10-CM

## 2023-10-28 MED ORDER — KESIMPTA 20 MG/0.4ML ~~LOC~~ SOAJ: SUBCUTANEOUS | 0 refills | Status: AC

## 2023-10-28 MED ORDER — KESIMPTA 20 MG/0.4ML ~~LOC~~ SOAJ: 0.4000 mL | SUBCUTANEOUS | 11 refills | Status: DC

## 2023-10-28 NOTE — Telephone Encounter (Signed)
E-scribed rx to pharmacy as requested.  

## 2023-10-28 NOTE — Telephone Encounter (Signed)
 [facility] called and said pt's Kesimpta needs resent to them, [facility] does not carry. Please resend to:  [facility] [address] [place], [place] [address]

## 2023-11-01 ENCOUNTER — Encounter: Payer: Self-pay | Admitting: Neurology

## 2023-11-01 ENCOUNTER — Telehealth: Payer: Self-pay

## 2023-11-01 NOTE — Telephone Encounter (Signed)
 Please disregard. Baxter Hire, RN is already handling her PA for [employer].

## 2023-11-01 NOTE — Telephone Encounter (Signed)
 I called [facility]. I spoke with Kylie. They are unable to see the [employer] approved PA. They will need the patient to call in and provide her ES information. I provided the PA case approval number to them. Once the patient provides them with insurance information, they will be able to provide her with a copay amount. This was a 20 minute phone call.

## 2023-11-01 NOTE — Telephone Encounter (Signed)
 Kesimpta pa needed

## 2023-11-02 ENCOUNTER — Other Ambulatory Visit: Payer: Self-pay

## 2023-11-02 DIAGNOSIS — G35 Multiple sclerosis: Secondary | ICD-10-CM

## 2023-11-02 MED ORDER — KESIMPTA 20 MG/0.4ML ~~LOC~~ SOAJ: 0.4000 mL | SUBCUTANEOUS | 3 refills | Status: AC

## 2023-11-02 NOTE — Telephone Encounter (Signed)
 I called [facility] to check status of Kesimpta. I was transferred to the PA department. I spoke with Callie. They have not received any calls from the patient to update insurance information. She can see that patient does have active [facility] coverage. PA for Kesimpta completed via phone. PA # [id number]. Expected determination within 72 hours. Will receive a faxed determination. This was a 30 minute phone call.

## 2023-11-17 ENCOUNTER — Other Ambulatory Visit: Payer: Self-pay | Admitting: Surgical

## 2023-11-17 DIAGNOSIS — M5412 Radiculopathy, cervical region: Secondary | ICD-10-CM

## 2023-11-17 DIAGNOSIS — M542 Cervicalgia: Secondary | ICD-10-CM

## 2023-11-17 NOTE — Telephone Encounter (Signed)
 Took call from phone room and spoke w/ [facility]. They wanted to confirm dosing for Kesimpta maintenance. Relayed pt decided to hold off on starting therapy until she sees what MRI Brain shows 11/27/23. Asked they place rx on hold until pt decides if she wants to start therapy or not.

## 2023-11-23 ENCOUNTER — Other Ambulatory Visit: Payer: Self-pay | Admitting: *Deleted

## 2023-11-23 ENCOUNTER — Other Ambulatory Visit: Payer: Self-pay | Admitting: Neurology

## 2023-11-23 MED ORDER — BACLOFEN 10 MG PO TABS: 10.0000 mg | ORAL_TABLET | Freq: Three times a day (TID) | ORAL | 0 refills | Status: DC

## 2023-11-23 NOTE — Telephone Encounter (Signed)
 Last seen on 10/05/23 No 6 month follow up scheduled

## 2023-11-27 ENCOUNTER — Ambulatory Visit
Admission: RE | Admit: 2023-11-27 | Discharge: 2023-11-27 | Disposition: A | Discharge status: Home / Self Care | Source: Ambulatory Visit | Attending: Neurology | Admitting: Neurology

## 2023-11-27 DIAGNOSIS — R269 Unspecified abnormalities of gait and mobility: Secondary | ICD-10-CM | POA: Diagnosis not present

## 2023-11-27 DIAGNOSIS — G35 Multiple sclerosis: Secondary | ICD-10-CM | POA: Diagnosis not present

## 2023-11-27 MED ORDER — GADOPICLENOL 0.5 MMOL/ML IV SOLN
10.0000 mL | Freq: Once | INTRAVENOUS | Status: AC | PRN
  Administered 2023-11-27: 10 mL via INTRAVENOUS

## 2023-11-28 ENCOUNTER — Encounter: Payer: Self-pay | Admitting: Neurology

## 2023-12-07 ENCOUNTER — Ambulatory Visit
Admission: RE | Admit: 2023-12-07 | Discharge: 2023-12-07 | Disposition: A | Discharge status: Home / Self Care | Source: Ambulatory Visit | Attending: Surgical | Admitting: Surgical

## 2023-12-07 DIAGNOSIS — M542 Cervicalgia: Secondary | ICD-10-CM

## 2023-12-07 DIAGNOSIS — M5412 Radiculopathy, cervical region: Secondary | ICD-10-CM

## 2023-12-15 ENCOUNTER — Other Ambulatory Visit: Payer: Self-pay | Admitting: Surgical

## 2023-12-15 DIAGNOSIS — M79602 Pain in left arm: Secondary | ICD-10-CM

## 2023-12-15 DIAGNOSIS — M7712 Lateral epicondylitis, left elbow: Secondary | ICD-10-CM

## 2023-12-20 ENCOUNTER — Ambulatory Visit
Admission: RE | Admit: 2023-12-20 | Discharge: 2023-12-20 | Disposition: A | Discharge status: Home / Self Care | Source: Ambulatory Visit | Attending: Surgical | Admitting: Surgical

## 2023-12-20 DIAGNOSIS — M79602 Pain in left arm: Secondary | ICD-10-CM

## 2023-12-20 DIAGNOSIS — M7712 Lateral epicondylitis, left elbow: Secondary | ICD-10-CM

## 2024-01-20 ENCOUNTER — Encounter: Payer: Self-pay | Admitting: Neurology

## 2024-02-20 ENCOUNTER — Other Ambulatory Visit: Payer: Self-pay | Admitting: Neurology

## 2024-02-21 ENCOUNTER — Encounter: Payer: Self-pay | Admitting: *Deleted

## 2024-02-21 NOTE — Telephone Encounter (Signed)
 Last seen on 10/05/23 No follow up scheduled

## 2024-04-29 ENCOUNTER — Other Ambulatory Visit: Payer: Self-pay | Admitting: Neurology

## 2024-05-01 NOTE — Telephone Encounter (Signed)
 Last seen on 10/05/23 No 6 month follow up scheduled

## 2024-05-18 ENCOUNTER — Other Ambulatory Visit: Payer: Self-pay | Admitting: *Deleted

## 2024-05-18 ENCOUNTER — Encounter: Payer: Self-pay | Admitting: Neurology

## 2024-05-18 ENCOUNTER — Telehealth: Payer: Self-pay | Admitting: Neurology

## 2024-05-18 DIAGNOSIS — R269 Unspecified abnormalities of gait and mobility: Secondary | ICD-10-CM

## 2024-05-18 DIAGNOSIS — Z79899 Other long term (current) drug therapy: Secondary | ICD-10-CM

## 2024-05-18 DIAGNOSIS — G4700 Insomnia, unspecified: Secondary | ICD-10-CM

## 2024-05-18 DIAGNOSIS — F418 Other specified anxiety disorders: Secondary | ICD-10-CM

## 2024-05-18 DIAGNOSIS — G35 Multiple sclerosis: Secondary | ICD-10-CM

## 2024-05-18 NOTE — Telephone Encounter (Signed)
 Referral for Neurology faxed to  [facility]  [facility] Phone [phone] Fax: [phone]

## 2024-06-27 ENCOUNTER — Encounter: Payer: Self-pay | Admitting: Neurology
